# Patient Record
Sex: Male | Born: 1985 | Race: Black or African American | Hispanic: No | Marital: Married | Smoking: Never smoker
Health system: Southern US, Community
[De-identification: ages and names within clinical notes are randomized; demographics above are authoritative.]

---
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
## 2005-02-08 ENCOUNTER — Emergency Department (HOSPITAL_COMMUNITY): Admission: EM | Admit: 2005-02-08 | Discharge: 2005-02-08 | Payer: Self-pay | Admitting: Family Medicine

## 2006-02-27 ENCOUNTER — Emergency Department (HOSPITAL_COMMUNITY): Admission: EM | Admit: 2006-02-27 | Discharge: 2006-02-28 | Payer: Self-pay | Admitting: Emergency Medicine

## 2006-02-28 ENCOUNTER — Emergency Department (HOSPITAL_COMMUNITY): Admission: EM | Admit: 2006-02-28 | Discharge: 2006-02-28 | Payer: Self-pay | Admitting: Family Medicine

## 2006-08-27 ENCOUNTER — Emergency Department (HOSPITAL_COMMUNITY): Admission: EM | Admit: 2006-08-27 | Discharge: 2006-08-27 | Payer: Self-pay | Admitting: Emergency Medicine

## 2009-04-15 ENCOUNTER — Emergency Department (HOSPITAL_COMMUNITY): Admission: EM | Admit: 2009-04-15 | Discharge: 2009-04-15 | Payer: Self-pay | Admitting: Emergency Medicine

## 2009-06-17 ENCOUNTER — Emergency Department (HOSPITAL_COMMUNITY): Admission: EM | Admit: 2009-06-17 | Discharge: 2009-06-17 | Payer: Self-pay | Admitting: Emergency Medicine

## 2010-05-07 ENCOUNTER — Inpatient Hospital Stay (INDEPENDENT_AMBULATORY_CARE_PROVIDER_SITE_OTHER)
Admission: RE | Admit: 2010-05-07 | Discharge: 2010-05-07 | Disposition: A | Discharge status: Home / Self Care | Payer: Self-pay | Source: Ambulatory Visit | Attending: Emergency Medicine | Admitting: Emergency Medicine

## 2010-05-07 ENCOUNTER — Ambulatory Visit (INDEPENDENT_AMBULATORY_CARE_PROVIDER_SITE_OTHER): Payer: Self-pay

## 2010-05-07 DIAGNOSIS — R0789 Other chest pain: Secondary | ICD-10-CM

## 2011-03-16 ENCOUNTER — Emergency Department (INDEPENDENT_AMBULATORY_CARE_PROVIDER_SITE_OTHER)
Admission: EM | Admit: 2011-03-16 | Discharge: 2011-03-16 | Disposition: A | Discharge status: Home / Self Care | Payer: Self-pay | Source: Home / Self Care

## 2011-03-16 DIAGNOSIS — J019 Acute sinusitis, unspecified: Secondary | ICD-10-CM

## 2011-03-16 DIAGNOSIS — J209 Acute bronchitis, unspecified: Secondary | ICD-10-CM

## 2011-03-16 MED ORDER — GUAIFENESIN-CODEINE 100-10 MG/5ML PO SYRP: ORAL_SOLUTION | ORAL | Status: AC

## 2011-03-16 MED ORDER — AMOXICILLIN 875 MG PO TABS: 875.0000 mg | ORAL_TABLET | Freq: Two times a day (BID) | ORAL | Status: AC

## 2011-03-16 NOTE — ED Notes (Signed)
C/o URI type syx for past few days

## 2011-03-16 NOTE — ED Provider Notes (Signed)
History     CSN: [id number]  Arrival date & time 03/16/11  1621   None     Chief Complaint  Patient presents with  . URI    (Consider location/radiation/quality/duration/timing/severity/associated sxs/prior treatment) HPI Comments: Pt c/o nasal congestion, cough and sore throat for approximately one week. Nasal drainage is thick, yellow and green. Cough is nonproductive, and is disruptive to sleep. No fever or chills. He has tried cough and cold medications without improvement.    History reviewed. No pertinent past medical history.  History reviewed. No pertinent past surgical history.  History reviewed. No pertinent family history.  History  Substance Use Topics  . Smoking status: Never Smoker   . Smokeless tobacco: Not on file  . Alcohol Use: Yes     social      Review of Systems  Constitutional: Negative for fever, chills and fatigue.  HENT: Positive for congestion, sore throat, rhinorrhea, postnasal drip and sinus pressure. Negative for ear pain and sneezing.   Respiratory: Positive for cough. Negative for shortness of breath and wheezing.   Cardiovascular: Negative for chest pain and palpitations.    Allergies  Review of patient's allergies indicates no known allergies.  Home Medications   Current Outpatient Rx  Name Route Sig Dispense Refill  . AMOXICILLIN 875 MG PO TABS Oral Take 1 tablet (875 mg total) by mouth 2 (two) times daily. 20 tablet 0  . GUAIFENESIN-CODEINE 100-10 MG/5ML PO SYRP  1-2 tsp every 6 hrs prn cough 120 mL 0    BP 112/75  Pulse 85  Temp(Src) 98 F (36.7 C) (Oral)  Resp 16  SpO2 100%  Physical Exam  Nursing note and vitals reviewed. Constitutional: He appears well-developed and well-nourished. No distress.  HENT:  Head: Normocephalic and atraumatic.  Right Ear: Tympanic membrane, external ear and ear canal normal.  Left Ear: Tympanic membrane, external ear and ear canal normal.  Nose: Mucosal edema present. Right sinus  exhibits maxillary sinus tenderness. Left sinus exhibits maxillary sinus tenderness.  Mouth/Throat: Uvula is midline, oropharynx is clear and moist and mucous membranes are normal. No oropharyngeal exudate, posterior oropharyngeal edema or posterior oropharyngeal erythema.  Neck: Neck supple.  Cardiovascular: Normal rate, regular rhythm and normal heart sounds.   Pulmonary/Chest: Effort normal and breath sounds normal. No respiratory distress.  Lymphadenopathy:    He has no cervical adenopathy.  Neurological: He is alert.  Skin: Skin is warm and dry.  Psychiatric: He has a normal mood and affect.    ED Course  Procedures (including critical care time)  Labs Reviewed - No data to display No results found.   1. Acute sinusitis   2. Acute bronchitis       MDM  Maxillary sinus tenderness, congestion and cough.        Melody Comas, [place] 03/16/11 1904

## 2011-06-17 ENCOUNTER — Encounter (HOSPITAL_BASED_OUTPATIENT_CLINIC_OR_DEPARTMENT_OTHER): Payer: Self-pay | Admitting: *Deleted

## 2011-06-17 ENCOUNTER — Emergency Department (HOSPITAL_BASED_OUTPATIENT_CLINIC_OR_DEPARTMENT_OTHER)
Admission: EM | Admit: 2011-06-17 | Discharge: 2011-06-17 | Disposition: A | Discharge status: Home / Self Care | Payer: Self-pay | Attending: Emergency Medicine | Admitting: Emergency Medicine

## 2011-06-17 ENCOUNTER — Emergency Department (INDEPENDENT_AMBULATORY_CARE_PROVIDER_SITE_OTHER): Payer: Self-pay

## 2011-06-17 DIAGNOSIS — M62838 Other muscle spasm: Secondary | ICD-10-CM | POA: Insufficient documentation

## 2011-06-17 DIAGNOSIS — M542 Cervicalgia: Secondary | ICD-10-CM

## 2011-06-17 MED ORDER — CYCLOBENZAPRINE HCL 5 MG PO TABS: 5.0000 mg | ORAL_TABLET | Freq: Two times a day (BID) | ORAL | Status: AC | PRN

## 2011-06-17 MED ORDER — CYCLOBENZAPRINE HCL 10 MG PO TABS
5.0000 mg | ORAL_TABLET | Freq: Once | ORAL | Status: AC
  Administered 2011-06-17: 5 mg via ORAL
  Filled 2011-06-17: qty 1

## 2011-06-17 MED ORDER — HYDROCODONE-ACETAMINOPHEN 5-500 MG PO TABS: 1.0000 | ORAL_TABLET | Freq: Four times a day (QID) | ORAL | Status: AC | PRN

## 2011-06-17 MED ORDER — HYDROCODONE-ACETAMINOPHEN 5-325 MG PO TABS
1.0000 | ORAL_TABLET | Freq: Once | ORAL | Status: AC
  Administered 2011-06-17: 1 via ORAL
  Filled 2011-06-17: qty 1

## 2011-06-17 NOTE — ED Provider Notes (Signed)
History     CSN: [id number]  Arrival date & time 06/17/11  1743   First MD Initiated Contact with Patient 06/17/11 1754      Chief Complaint  Patient presents with  . Neck Pain    (Consider location/radiation/quality/duration/timing/severity/associated sxs/prior treatment) HPI Comments: Pt states that he was laying in bed and turned his head and now he feels like he can't turn his head:pt denies numbness or weakness:pt has a history of spasm in neck  Patient is a 26 y.o. male presenting with neck pain. The history is provided by the patient. No language interpreter was used.  Neck Pain  This is a new problem. The current episode started less than 1 hour ago. The problem occurs constantly. The problem has not changed since onset.The pain is associated with twisting. There has been no fever. The pain is present in the left side. The quality of the pain is described as shooting. The pain radiates to the left shoulder. The symptoms are aggravated by bending and twisting. Pertinent negatives include no numbness, no bladder incontinence, no tingling and no weakness. He has tried nothing for the symptoms.    History reviewed. No pertinent past medical history.  History reviewed. No pertinent past surgical history.  No family history on file.  History  Substance Use Topics  . Smoking status: Never Smoker   . Smokeless tobacco: Not on file  . Alcohol Use: Yes     social      Review of Systems  Constitutional: Negative.   HENT: Positive for neck pain.   Respiratory: Negative.   Cardiovascular: Negative.   Genitourinary: Negative for bladder incontinence.  Neurological: Negative for tingling, weakness and numbness.    Allergies  Review of patient's allergies indicates no known allergies.  Home Medications  No current outpatient prescriptions on file.  BP 97/53  Pulse 73  Resp 18  SpO2 100%  Physical Exam  Nursing note and vitals reviewed. Constitutional: He is oriented  to person, place, and time. He appears well-developed and well-nourished.  HENT:  Head: Normocephalic and atraumatic.  Eyes: Conjunctivae and EOM are normal.  Neck: Neck supple.  Cardiovascular: Normal rate and regular rhythm.   Pulmonary/Chest: Effort normal and breath sounds normal.  Musculoskeletal:       Cervical back: He exhibits decreased range of motion and tenderness.       Thoracic back: Normal.       Lumbar back: Normal.  Neurological: He is alert and oriented to person, place, and time. He exhibits normal muscle tone. Coordination normal.  Skin: Skin is dry.  Psychiatric: He has a normal mood and affect.    ED Course  Procedures (including critical care time)  Labs Reviewed - No data to display Dg Cervical Spine Complete  06/17/2011  *RADIOLOGY REPORT*  Clinical Data: Left neck pain for 1 hour.  CERVICAL SPINE - COMPLETE 4+ VIEW  Comparison: None.  Findings: No prevertebral soft tissue swelling is identified.  No cervical vertebral malalignment noted.  No cervical spine fracture is evident.  IMPRESSION:  No significant abnormality identified.  Original Report Authenticated By: Dellia Cloud, M.D.     1. Muscle spasm       MDM  Likely spasm based on exam:pt is feeling better after the medications here        Teressa Lower, NP 06/17/11 1916

## 2011-06-17 NOTE — Discharge Instructions (Signed)
Muscle Cramps Muscle cramps are when muscles tighten by themselves. Muscle cramps usually improve or go away within minutes. HOME CARE  Massage the muscle.   Stretch the muscle.   Relax the muscle.   Only take medicine as told by your doctor.   Drink enough fluids to keep your pee (urine) clear or pale yellow.  GET HELP RIGHT AWAY IF:  Cramps are frequent and do not get better with medicine. MAKE SURE YOU:  Understand these intructions.   Will watch your condition.   Will get help right away if your are not doing well or get worse.  Document Released: 02/11/2008 Document Revised: 02/17/2011 Document Reviewed: 02/20/2008 [employer] Patient Information 2012 [employer]. 

## 2011-06-17 NOTE — ED Notes (Signed)
Pt sts sudden onset of sharp left neck and shoulder pain without injury 1 hour ago.

## 2011-06-17 NOTE — ED Notes (Signed)
Pt states pain at tolerable level. Ambulates without distress.

## 2011-06-18 NOTE — ED Provider Notes (Signed)
Medical screening examination/treatment/procedure(s) were performed by non-physician practitioner and as supervising physician I was immediately available for consultation/collaboration.   Glynn Octave, MD 06/18/11 [phone]

## 2013-03-10 ENCOUNTER — Emergency Department (HOSPITAL_BASED_OUTPATIENT_CLINIC_OR_DEPARTMENT_OTHER): Payer: Self-pay

## 2013-03-10 ENCOUNTER — Emergency Department (HOSPITAL_BASED_OUTPATIENT_CLINIC_OR_DEPARTMENT_OTHER)
Admission: EM | Admit: 2013-03-10 | Discharge: 2013-03-10 | Disposition: A | Discharge status: Home / Self Care | Payer: Self-pay | Attending: Emergency Medicine | Admitting: Emergency Medicine

## 2013-03-10 ENCOUNTER — Encounter (HOSPITAL_BASED_OUTPATIENT_CLINIC_OR_DEPARTMENT_OTHER): Payer: Self-pay | Admitting: Emergency Medicine

## 2013-03-10 DIAGNOSIS — J111 Influenza due to unidentified influenza virus with other respiratory manifestations: Secondary | ICD-10-CM | POA: Insufficient documentation

## 2013-03-10 DIAGNOSIS — IMO0001 Reserved for inherently not codable concepts without codable children: Secondary | ICD-10-CM | POA: Insufficient documentation

## 2013-03-10 DIAGNOSIS — R6889 Other general symptoms and signs: Secondary | ICD-10-CM

## 2013-03-10 LAB — BASIC METABOLIC PANEL
BUN: 14 mg/dL (ref 6–23)
CO2: 25 mEq/L (ref 19–32)
Glucose, Bld: 89 mg/dL (ref 70–99)
Potassium: 3.9 mEq/L (ref 3.5–5.1)
Sodium: 139 mEq/L (ref 135–145)

## 2013-03-10 MED ORDER — ONDANSETRON HCL 4 MG/2ML IJ SOLN
4.0000 mg | Freq: Once | INTRAMUSCULAR | Status: AC
  Administered 2013-03-10: 4 mg via INTRAVENOUS
  Filled 2013-03-10: qty 2

## 2013-03-10 MED ORDER — PHENYLEPH-PROMETHAZINE-COD 5-6.25-10 MG/5ML PO SYRP: 5.0000 mL | ORAL_SOLUTION | ORAL | Status: DC | PRN

## 2013-03-10 MED ORDER — SODIUM CHLORIDE 0.9 % IV BOLUS (SEPSIS)
1000.0000 mL | Freq: Once | INTRAVENOUS | Status: AC
  Administered 2013-03-10: 1000 mL via INTRAVENOUS

## 2013-03-10 NOTE — ED Notes (Signed)
Pt reports URI, congestion, productive cough since Tuesday.  Denies fevers.

## 2013-03-10 NOTE — ED Provider Notes (Signed)
CSN: [id number]     Arrival date & time 03/10/13  1355 History   First MD Initiated Contact with Patient 03/10/13 1745     Chief Complaint  Patient presents with  . URI   (Consider location/radiation/quality/duration/timing/severity/associated sxs/prior Treatment) Patient is a 27 y.o. male presenting with URI.  URI Presenting symptoms: congestion, cough, fever and sore throat   Severity:  Moderate Onset quality:  Gradual Duration:  3 days Timing:  Constant Progression:  Worsening Chronicity:  New Relieved by:  Nothing Worsened by:  Nothing tried Ineffective treatments:  OTC medications Associated symptoms: myalgias and sneezing   Associated symptoms: no headaches, no neck pain, no swollen glands and no wheezing   Risk factors: sick contacts    Donald Baird is a 27 y.o. male who presents to the ED with cough, cold, congestion that started 5 days ago. No relief with OTC medications. really hot and took his temperature and it was 101.   History reviewed. No pertinent past medical history. History reviewed. No pertinent past surgical history. History reviewed. No pertinent family history. History  Substance Use Topics  . Smoking status: Never Smoker   . Smokeless tobacco: Not on file  . Alcohol Use: Yes     Comment: social    Review of Systems  Constitutional: Positive for fever and chills.  HENT: Positive for congestion, sneezing and sore throat. Negative for trouble swallowing.   Eyes: Negative for visual disturbance.  Respiratory: Positive for cough. Negative for wheezing.   Cardiovascular: Negative for chest pain.  Gastrointestinal: Negative for nausea and vomiting.  Genitourinary: Negative for dysuria, urgency and frequency.  Musculoskeletal: Positive for myalgias. Negative for neck pain.  Allergic/Immunologic: Negative for immunocompromised state.  Neurological: Negative for syncope and headaches.  Psychiatric/Behavioral: Negative for confusion. The patient  is not nervous/anxious.     Allergies  Review of patient's allergies indicates no known allergies.  Home Medications  No current outpatient prescriptions on file. BP 104/71  Pulse 71  Temp(Src) 98.3 F (36.8 C) (Oral)  Resp 18  Ht 5\' 7"  (1.702 m)  Wt 125 lb (56.7 kg)  BMI 19.57 kg/m2  SpO2 99% Physical Exam  Nursing note and vitals reviewed. Constitutional: He is oriented to person, place, and time. He appears well-developed and well-nourished. No distress.  HENT:  Head: Normocephalic and atraumatic.  Eyes: EOM are normal.  Neck: Neck supple.  Cardiovascular: Normal rate and regular rhythm.   Pulmonary/Chest: Effort normal. He has no wheezes. He has no rales. He exhibits no tenderness.  Abdominal: Soft. There is no tenderness.  Musculoskeletal: Normal range of motion.  Neurological: He is alert and oriented to person, place, and time. No cranial nerve deficit.  Skin: Skin is warm and dry.  Psychiatric: He has a normal mood and affect. His behavior is normal.    ED Course  Procedures (including critical care time) Labs Review Labs Reviewed  RAPID STREP SCREEN  CULTURE, GROUP A STREP   Imaging Review Dg Chest 2 View  03/10/2013   CLINICAL DATA:  Cough and congestion  EXAM: CHEST  2 VIEW  COMPARISON:  May 07, 2010  FINDINGS: The lungs are clear. The heart size and pulmonary vascularity are normal. No adenopathy. There is mild lower thoracic levoscoliosis.  IMPRESSION: No edema or consolidation.   Electronically Signed   By: Bretta Bang M.D.   On: 03/10/2013 17:22    EKG Interpretation   None     Negative strep screen, normal BMP and CXR  without pneumonia.  Will treat symptoms.   MDM  27 y.o. male with flu like symptoms. I have reviewed this patient's vital signs, nurses notes, appropriate labs and imaging.  I have discussed findings with the patient and plan of care. He voices understanding.    Medication List         Phenyleph-Promethazine-Cod  5-6.25-10 MG/5ML Syrp  Take 5 mLs by mouth every 4 (four) hours as needed.          Janne Napoleon, [place] 03/13/13 [phone]

## 2013-03-12 LAB — CULTURE, GROUP A STREP

## 2013-03-13 NOTE — ED Provider Notes (Signed)
Medical screening examination/treatment/procedure(s) were performed by non-physician practitioner and as supervising physician I was immediately available for consultation/collaboration.  EKG Interpretation   None         Megan E Docherty, MD 03/13/13 1537 

## 2013-07-15 ENCOUNTER — Encounter: Payer: Self-pay | Admitting: Medical

## 2014-04-01 ENCOUNTER — Emergency Department (HOSPITAL_COMMUNITY)
Admission: EM | Admit: 2014-04-01 | Discharge: 2014-04-01 | Disposition: A | Discharge status: Home / Self Care | Payer: BLUE CROSS/BLUE SHIELD | Attending: Emergency Medicine | Admitting: Emergency Medicine

## 2014-04-01 ENCOUNTER — Emergency Department (HOSPITAL_COMMUNITY): Payer: BLUE CROSS/BLUE SHIELD

## 2014-04-01 ENCOUNTER — Encounter (HOSPITAL_COMMUNITY): Payer: Self-pay | Admitting: Emergency Medicine

## 2014-04-01 DIAGNOSIS — J3489 Other specified disorders of nose and nasal sinuses: Secondary | ICD-10-CM | POA: Insufficient documentation

## 2014-04-01 DIAGNOSIS — R197 Diarrhea, unspecified: Secondary | ICD-10-CM | POA: Insufficient documentation

## 2014-04-01 DIAGNOSIS — R6889 Other general symptoms and signs: Secondary | ICD-10-CM

## 2014-04-01 DIAGNOSIS — R05 Cough: Secondary | ICD-10-CM | POA: Insufficient documentation

## 2014-04-01 DIAGNOSIS — R509 Fever, unspecified: Secondary | ICD-10-CM | POA: Insufficient documentation

## 2014-04-01 DIAGNOSIS — R0602 Shortness of breath: Secondary | ICD-10-CM | POA: Diagnosis not present

## 2014-04-01 DIAGNOSIS — R112 Nausea with vomiting, unspecified: Secondary | ICD-10-CM | POA: Insufficient documentation

## 2014-04-01 DIAGNOSIS — R0981 Nasal congestion: Secondary | ICD-10-CM | POA: Diagnosis present

## 2014-04-01 MED ORDER — ONDANSETRON HCL 4 MG PO TABS: 4.0000 mg | ORAL_TABLET | Freq: Four times a day (QID) | ORAL | Status: DC

## 2014-04-01 MED ORDER — HYDROCOD POLST-CHLORPHEN POLST 10-8 MG/5ML PO LQCR: 5.0000 mL | Freq: Two times a day (BID) | ORAL | Status: DC | PRN

## 2014-04-01 NOTE — ED Notes (Signed)
Pt. reports nasal congestion , productive cough , sore throat runny nose onset Friday last week , denies fever or chills.

## 2014-04-01 NOTE — ED Provider Notes (Signed)
CSN: [id number]     Arrival date & time 04/01/14  0012 History   First MD Initiated Contact with Patient 04/01/14 0022     Chief Complaint  Patient presents with  . Nasal Congestion  . Cough     (Consider location/radiation/quality/duration/timing/severity/associated sxs/prior Treatment) Patient is a 29 y.o. male presenting with cough. The history is provided by the patient.  Cough Cough characteristics:  Productive Sputum characteristics:  Yellow Severity:  Moderate Onset quality:  Gradual Duration:  3 days Timing:  Intermittent Progression:  Worsening Chronicity:  New Smoker: no   Relieved by:  Nothing Worsened by:  Lying down Ineffective treatments:  Decongestant, fluids and cough suppressants Associated symptoms: fever and shortness of breath    Donald DolinClarence L Baird is a 29 y.o. male who presents to the ED with cough, cold and congestion that started 3 days ago. The symptoms have gotten worse and he wakes up coughing and sweating. He has been taking OTC medications without relief. Today he had 2 watery stools and one episode of vomiting. He is unsure if he has had fever.   History reviewed. No pertinent past medical history. History reviewed. No pertinent past surgical history. No family history on file. History  Substance Use Topics  . Smoking status: Never Smoker   . Smokeless tobacco: Not on file  . Alcohol Use: Yes     Comment: social    Review of Systems  Constitutional: Positive for fever.  Respiratory: Positive for cough and shortness of breath.   Gastrointestinal: Positive for nausea, vomiting and diarrhea.  All other systems negative     Allergies  Review of patient's allergies indicates no known allergies.  Home Medications   Prior to Admission medications   Medication Sig Start Date End Date Taking? Authorizing Provider  chlorpheniramine-HYDROcodone (TUSSIONEX PENNKINETIC ER) 10-8 MG/5ML LQCR Take 5 mLs by mouth every 12 (twelve) hours as needed  for cough. 04/01/14   Hope Orlene OchM Neese, NP  ondansetron (ZOFRAN) 4 MG tablet Take 1 tablet (4 mg total) by mouth every 6 (six) hours. 04/01/14   Hope Orlene OchM Neese, NP  Phenyleph-Promethazine-Cod 5-6.25-10 MG/5ML SYRP Take 5 mLs by mouth every 4 (four) hours as needed. 03/10/13   Hope Orlene OchM Neese, NP   BP 108/65 mmHg  Pulse 63  Temp(Src) 97.6 F (36.4 C) (Oral)  Resp 14  Ht 5\' 7"  (1.702 m)  Wt 123 lb (55.792 kg)  BMI 19.26 kg/m2  SpO2 100% Physical Exam  Constitutional: He is oriented to person, place, and time. He appears well-developed and well-nourished. No distress.  HENT:  Head: Normocephalic.  Right Ear: Tympanic membrane normal.  Left Ear: Tympanic membrane normal.  Nose: Mucosal edema and rhinorrhea present.  Mouth/Throat: Uvula is midline, oropharynx is clear and moist and mucous membranes are normal.  Eyes: EOM are normal.  Neck: Normal range of motion. Neck supple.  Cardiovascular: Normal rate and regular rhythm.   Pulmonary/Chest: Effort normal. No respiratory distress. He has no wheezes. He has no rales.  Abdominal: Soft. There is no tenderness.  Musculoskeletal: Normal range of motion.  Lymphadenopathy:    He has no cervical adenopathy.  Neurological: He is alert and oriented to person, place, and time. No cranial nerve deficit.  Skin: Skin is warm and dry.  Psychiatric: He has a normal mood and affect. His behavior is normal.  Nursing note and vitals reviewed.   ED Course  Procedures (including critical care time) Labs Review Labs Reviewed - No data to display  Imaging Review Dg Chest 2 View  04/01/2014   CLINICAL DATA:  Cough, fever, chills and body aches. Diarrhea for 3 days.  EXAM: CHEST  2 VIEW  COMPARISON:  03/02/2013.  FINDINGS: Normal heart, mediastinum and hila. Clear lungs. No pleural effusion or pneumothorax. Bony thorax is unremarkable.  IMPRESSION: Normal chest radiographs.   Electronically Signed   By: Amie Portland M.D.   On: 04/01/2014 01:19    MDM  29 y.o.  male with cough, congestion, sore throat x 3 days and  n/v/d x 1 day. Stable for discharge with O2 SAT 100% on R/A. No signs of dehydration. Will treat for bronchitis and n/v/d. He will return for worsening symptoms. I did discuss with the patient that this is most likely a viral illness, however, with his hx of night sweats and diarrhea we would offer HIV screening. He declines since he has not risk factor for HIV.   Final diagnoses:  Flu-like symptoms  Nausea vomiting and diarrhea      Janne Napoleon, NP 04/01/14 0130  Purvis Sheffield, MD 04/01/14 [phone]

## 2014-04-01 NOTE — ED Notes (Signed)
Pt. Left with all belongings and refused wheelchair 

## 2015-03-30 ENCOUNTER — Emergency Department (HOSPITAL_COMMUNITY)
Admission: EM | Admit: 2015-03-30 | Discharge: 2015-03-31 | Disposition: A | Discharge status: Home / Self Care | Payer: BLUE CROSS/BLUE SHIELD | Attending: Emergency Medicine | Admitting: Emergency Medicine

## 2015-03-30 ENCOUNTER — Encounter (HOSPITAL_COMMUNITY): Payer: Self-pay | Admitting: Emergency Medicine

## 2015-03-30 DIAGNOSIS — R21 Rash and other nonspecific skin eruption: Secondary | ICD-10-CM | POA: Insufficient documentation

## 2015-03-30 MED ORDER — FLUORESCEIN SODIUM 1 MG OP STRP
1.0000 | ORAL_STRIP | Freq: Once | OPHTHALMIC | Status: AC
  Administered 2015-03-30: 1 via OPHTHALMIC
  Filled 2015-03-30: qty 1

## 2015-03-30 MED ORDER — TETRACAINE HCL 0.5 % OP SOLN
1.0000 [drp] | Freq: Once | OPHTHALMIC | Status: AC
  Administered 2015-03-30: 1 [drp] via OPHTHALMIC
  Filled 2015-03-30: qty 2

## 2015-03-30 NOTE — ED Notes (Signed)
Tetracaine and fluorescein strip at bedside for PA-C use

## 2015-03-30 NOTE — ED Provider Notes (Signed)
CSN: [id number]     Arrival date & time 03/30/15  2318 History  By signing my name below, I, Elon Spanner, attest that this documentation has been prepared under the direction and in the presence of Glean Hess, [place]. Electronically Signed: Elon Spanner ED Scribe. 03/30/2015. 11:29 PM.    Chief Complaint  Patient presents with  . Rash    The history is provided by the patient. No language interpreter was used.   HPI Comments: Donald Baird is a 30 y.o. male who presents to the Emergency Department complaining of a constant, intermittently itching rash beside the right eye onset 1 month ago.  He reports the rash seems to worsen while at work and improve with taking a shower.  The patient was seen by a dermatologist two weeks ago.  He was dx'd with eczema, prescribed desonide ointment, and told to keep the area moisturized with vaseline.  This tx caused the rash to shrink from covering his whole face to its current location, however, he believes the focal area today is more intense.  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 He denies new medications, face creams/soaps/lotions, known bites.  He denies fever, chills, eye pain, eye redness, eye drainage, vision changes, photophobia.    History reviewed. No pertinent past medical history. History reviewed. No pertinent past surgical history. History reviewed. No pertinent family history. Social History  Substance Use Topics  . Smoking status: Never Smoker   . Smokeless tobacco: None  . Alcohol Use: Yes     Comment: social      Review of Systems  Constitutional: Negative for fever and chills.  Eyes: Negative for photophobia, pain, discharge, redness, itching and visual disturbance.  Skin: Positive for rash.     Allergies  Review of patient's allergies indicates no known allergies.  Home Medications   Prior to Admission medications   Medication Sig Start Date End  Date Taking? Authorizing Provider  chlorpheniramine-HYDROcodone (TUSSIONEX PENNKINETIC ER) 10-8 MG/5ML LQCR Take 5 mLs by mouth every 12 (twelve) hours as needed for cough. 04/01/14   Hope Orlene Och, NP  ondansetron (ZOFRAN) 4 MG tablet Take 1 tablet (4 mg total) by mouth every 6 (six) hours. 04/01/14   Hope Orlene Och, NP  Phenyleph-Promethazine-Cod 5-6.25-10 MG/5ML SYRP Take 5 mLs by mouth every 4 (four) hours as needed. 03/10/13   Hope Orlene Och, NP    BP 109/77 mmHg  Pulse 76  Temp(Src) 98.4 F (36.9 C) (Oral)  Resp 16  Ht 5\' 7"  (1.702 m)  Wt 56.7 kg  BMI 19.57 kg/m2  SpO2 99% Physical Exam  Constitutional: He is oriented to person, place, and time. He appears well-developed and well-nourished. No distress.  HENT:  Head: Normocephalic and atraumatic.    Right Ear: External ear normal.  Left Ear: External ear normal.  Nose: Nose normal.  Mouth/Throat: Oropharynx is clear and moist.  Eyes: Conjunctivae, EOM and lids are normal. Pupils are equal, round, and reactive to light. Right eye exhibits no discharge and no exudate. No foreign body present in the right eye. Left eye exhibits no discharge and no exudate. No foreign body present in the left eye. Right conjunctiva is not injected. Right conjunctiva has no hemorrhage. Left conjunctiva is not injected. Left conjunctiva has no hemorrhage. No scleral icterus.  Slit lamp exam:      The right eye shows no corneal abrasion, no corneal flare, no corneal ulcer, no foreign  body, no hyphema and no fluorescein uptake.       The left eye shows no corneal abrasion, no corneal flare, no corneal ulcer, no foreign body, no hyphema and no fluorescein uptake.  Neck: Normal range of motion. Neck supple.  Cardiovascular: Normal rate and regular rhythm.   Pulmonary/Chest: Effort normal and breath sounds normal. No respiratory distress.  Musculoskeletal: Normal range of motion. He exhibits no edema or tenderness.  Neurological: He is alert and oriented to  person, place, and time.  Skin: Skin is warm and dry. Rash noted. He is not diaphoretic.  Papular rash to right cheek lateral to right eye. No significant erythema. No drainage. No vesicles. No skin sloughing.   Psychiatric: He has a normal mood and affect. His behavior is normal.  Nursing note and vitals reviewed.   ED Course  Procedures (including critical care time)  DIAGNOSTIC STUDIES: Oxygen Saturation is 99% on RA, normal by my interpretation.    COORDINATION OF CARE:  Labs Review Labs Reviewed - No data to display  Imaging Review No results found.    EKG Interpretation None      MDM   Final diagnoses:  Rash    30 year old male presents with rash to the right side of his face. States his symptoms have been present for the past month, and initially his rash was diffuse, however is now concentrated to the right side of his face. He reports associated itching, and states his symptoms seem to flare while he is at work (he notes exposure to dust particles) and improve after he washes his face or showers when he comes home. He denies fever, chills, eye pain, eye redness, photophobia, vision changes.  Patient is afebrile. Vital signs stable. On exam, patient has papular rash to right cheek lateral to right eye. No significant erythema or drainage to suggest infection. No vesicles. No skin sloughing. No conjunctival injection. PERRL. EOMs intact. No abrasion or increased fluorescein uptake on slit lamp exam.   Symptoms seem to be related to work exposure, may be contact dermatitis. Advised to try over the counter hydrocortisone cream and to use eye protection while at work (recommended safety glasses to limit dust particles contacting face). Recommended he can try benadryl at night for itching. Patient is non-toxic and well-appearing, feel he is stable for discharge at this time. Return precautions discussed. Patient to follow-up with dermatologist for further evaluation and  management. Patient verbalizes his understanding and is in agreement with plan.  BP 109/77 mmHg  Pulse 76  Temp(Src) 98.4 F (36.9 C) (Oral)  Resp 16  Ht 5\' 7"  (1.702 m)  Wt 56.7 kg  BMI 19.57 kg/m2  SpO2 99%  I personally performed the services described in this documentation, which was scribed in my presence. The recorded information has been reviewed and is accurate.    Mady Gemmalizabeth C Westfall, PA-C 03/31/15 0104  Leta BaptistEmily Roe Nguyen, MD 04/08/15 2112

## 2015-03-30 NOTE — ED Notes (Signed)
Patient c/o rash beside R eye, about ping-pong ball size now x 1 month. Patient states it started out as a small spot and was seen by a dermatologist, who diagnosed him with eczema and was given Desonide Ointment to use for 2 weeks. Spot has increased in size and has become more itchy. Patient also c/o R eye irritation in the morning.

## 2015-03-31 NOTE — Discharge Instructions (Signed)
1. Medications: try over the counter topical hydrocortisone cream, benadryl for itching, usual home medications 2. Treatment: rest, drink plenty of fluids 3. Follow Up: please followup with dermatology for discussion of your diagnoses and further evaluation after today's visit; if you do not have a primary care doctor use the resource guide provided to find one; please return to the ER for redness, swelling, discharge, eye pain, vision changes, new or worsening symptoms   Emergency Department Resource Guide 1) Find a Doctor and Pay Out of Pocket Although you won't have to find out who is covered by your insurance plan, it is a good idea to ask around and get recommendations. You will then need to call the office and see if the doctor you have chosen will accept you as a new patient and what types of options they offer for patients who are self-pay. Some doctors offer discounts or will set up payment plans for their patients who do not have insurance, but you will need to ask so you aren't surprised when you get to your appointment.  2) Contact Your Local Health Department Not all health departments have doctors that can see patients for sick visits, but many do, so it is worth a call to see if yours does. If you don't know where your local health department is, you can check in your phone book. The CDC also has a tool to help you locate your state's health department, and many state websites also have listings of all of their local health departments.  3) Find a Walk-in Clinic If your illness is not likely to be very severe or complicated, you may want to try a walk in clinic. These are popping up all over the country in pharmacies, drugstores, and shopping centers. They're usually staffed by nurse practitioners or physician assistants that have been trained to treat common illnesses and complaints. They're usually fairly quick and inexpensive. However, if you have serious medical issues or chronic  medical problems, these are probably not your best option.  No Primary Care Doctor: - Call Health Connect at  [phone] - they can help you locate a primary care doctor that  accepts your insurance, provides certain services, etc. - Physician Referral Service- [phone]  Chronic Pain Problems: Organization         Address  Phone   Notes  [facility]  [phone] Patients need to be referred by their primary care doctor.   Medication Assistance: Organization         Address  Phone   Notes  [facility] Medication [facility] [address] [place] [place], [place] [address] [phone] --Must be a resident of [facility] -- Must have NO insurance coverage whatsoever (no Medicaid/ Medicare, etc.) -- The pt. MUST have a primary care doctor that directs their care regularly and follows them in the community   MedAssist  [phone]   [employer]  [phone]    Agencies that provide inexpensive medical care: Organization         Address  Phone   Notes  [facility]  [phone]   [facility]    [phone]   [facility] [address] [place], [place] [address] [phone]   [facility] [address] [place] [place] [phone]   Planned Parenthood    [phone]   [facility]    [phone]  Community Health and [place]  [place] [address], [place] Phone:  [phone], Fax:  [phone] Hours of Operation:  9 am - 6 pm, M-F.  Also accepts Medicaid/Medicare and self-pay.  [facility] for [place] [address], [place] Phone: [phone], Fax: [phone]. Hours of Operation:  8:30 am - 5:30 pm, M-F.  Also accepts Medicaid and self-pay.  [facility] [place] [address] [place] Phone: [phone]   [place], [place], [place] [phone],  Ext. [phone] Mondays & Thursdays: 7-9 AM.  First 15 patients are seen on a first come, first serve basis.    [place] Providers:  Organization         Address  Phone   Notes  [facility] [address], [place] [phone] Also accepts self-pay patients.  [facility] [id number] [place], [place]  [phone]   [place], [address], [place] [phone]   [facility] [address], [place] [phone]   Lucianne Lei [address], [place]   [phone] Only accepts [place] Access [place] patients after they have their name applied to their card.   Self-Pay (no insurance) in [facility]:  Organization         Address  Phone   Notes  Sickle Cell Patients, [facility] [place] [phone]   [facility] Urgent Care [place] [phone]   [facility] [place]  [place], [place], [place] [phone]   [facility]/Dr. Osei-Bonsu  [address], [place] [phone] Phone number for both [place] and [place] locations is the same.  Urgent Medical and [facility] [address] [place] [phone]   [facility] [address], [place] or [address] [phone]   [facility] [address], [place] [phone], phone; [phone], fax Sees patients 1st and 3rd Saturday of every month.  Must not qualify for public or private insurance (i.e. Medicaid, Medicare, [place] Health Choice, Veterans' Benefits)  Household income should be no more than 200% of the poverty level The clinic cannot treat you if you are pregnant or think you are pregnant  Sexually transmitted diseases are not  treated at the clinic.    Dental Care: Organization         Address  Phone  Notes  [facility] Department of [facility] [place] [phone] Accepts children up to age 60 who are enrolled in [place] or [place]; pregnant women with a Medicaid card; and children who have applied for Medicaid or [place] Health Choice, but were declined, whose parents can pay a reduced fee at time of service.  [facility] Department of [facility]  [address], [place] [phone] Accepts children up to age 20 who are enrolled in [place] or [place]; pregnant women with a Medicaid card; and children who have applied for Medicaid or [place] Health Choice, but were declined, whose parents can pay a reduced fee at time of service.  [place]  [phone]  [place] [phone] Patients are seen by appointment only. Walk-ins are not accepted. [place] will see patients 19 years of age and older. Monday - Tuesday (8am-5pm) Most Wednesdays (8:30-5pm) $30 per visit, cash only  [facility] Adult Dental Access PROGRAM  [address], [facility] [phone] Patients are seen by appointment only. Walk-ins are not accepted. [place] will see patients 68 years of age and older. One Wednesday Evening (Monthly: Volunteer Based).  $30 per visit, cash only  [place]  [phone] for adults; Children under age 42, call Graduate Pediatric Dentistry at [phone]. Children aged 50-14, please call [phone] to request a pediatric application.  Dental services are provided in all areas of dental care including fillings, crowns and bridges, complete and partial dentures, implants, gum treatment, root canals, and extractions. Preventive care is also provided. Treatment is provided to both adults and children. Patients are selected via a lottery and there is  often a waiting list.   [facility] [address] [place]  [phone] [email or web address]   [facility] [address] [place], [place] [phone], Ext. [phone] Second and Fourth Thursday of each month, opens at 6:30 AM; Clinic ends at 9 AM.  Patients are seen on a first-come first-served basis, and a limited number are seen during each clinic.   [facility]  [address] Hillard Danker [place], [place] [phone]   Eligibility Requirements You must have lived in [place], [place], or [place] counties for at least the last three months.   You cannot be eligible for state or federal sponsored [employer], including [employer], [place], or [employer].   You generally cannot be eligible for healthcare insurance through your employer.    How to apply: Eligibility screenings are held every Tuesday and Wednesday afternoon from 1:00 pm until 4:00 pm. You do not need an appointment for the interview!  [facility] [address] [place], [place]   [place]  [place] Department  [place]  [phone]    Behavioral Health Resources in the Community: Intensive Outpatient Programs Organization         Address  Phone  Notes  [facility] [place]. [address] [place], [place] [phone]   [facility] Outpatient [address], [place], [place]   ADS: Alcohol & Drug S[facility]cs [address], [place], [place]   [place] [address]  [place], [place] or [phone]   Substance Abuse Resources Organization         Address  Phone  Notes  Alcohol and Drug Services  [phone]   [place]  [phone]   [facility] [place]   Chinita Pester  [phone]   Residential & Outpatient Substance  Abuse Program  [phone]   Psychological Services Organization         Address  Phone  Notes  [facility] [place]  [place]  [phone]   [place] [address] [place] or [phone]    Mobile Crisis Teams Organization         Address  Phone  Notes  [facility], Mobile Crisis Care Unit  [phone]   [facility]otherapeutic [facility]  [address] [place], [place]  [facility] [address], [address] [place] [phone]    Self-Help/Support Groups Organization         Address  Phone             Notes  [facility] [place] - variety of support groups  [place] Call for more information  Narcotics Anonymous (NA), [facility] [address], [employer] [place]  2 meetings at this location   Special educational needs teacher         Address  Phone  Notes  [facility] [place],    [place]  [phone]   [facility]  [address] [place] [phone], [place], [place]   [place], [place] [phone] Admissions: 8am-3pm M-F  Incentives Substance [place] [address],    [place], [place] [phone]   [facility] [address] [place], [place], [place]   [facility] [address]  [place], [place]   [facility] Programs - Intensive Outpatient [address] Kristeen [address], [place], [place]   [facility] ([place].,  [place], [place] [phone] or [phone]   [facility] (RTS) [address], [place], [place] Accepts Medicaid  [facility] [place] [address]  [place] [phone] Substance Abuse/Addiction Treatment   [facility] Organization          Address  Phone  Notes  [facility]  [phone]   Domenic Schwab, PhD [address] Arlis Porta [place], [place]   2818698019 or [phone]   [place], [place] [phone]   [facility] [address] [place], [place] [phone] Insurance/Medicaid/sponsorship through [facility] and Families [address] [place]                                    [place], [place] [phone] [place] [address], [place] [phone]    Dr. Adele Schilder  [phone]   [facility] Dept. 1) [address] [place] 2) [place] 3)  [place] [address], [place] [phone]  [phone]   [place] [phone] or [phone] (After Hours)

## 2015-03-31 NOTE — ED Notes (Signed)
Patient verbalized understanding of discharge instructions and denies any further needs or questions at this time. VS stable. Patient ambulatory with steady gait.  

## 2016-10-30 ENCOUNTER — Encounter (HOSPITAL_COMMUNITY): Payer: Self-pay | Admitting: Emergency Medicine

## 2016-10-30 ENCOUNTER — Emergency Department (HOSPITAL_COMMUNITY)
Admission: EM | Admit: 2016-10-30 | Discharge: 2016-10-30 | Disposition: A | Discharge status: Home / Self Care | Payer: BLUE CROSS/BLUE SHIELD | Attending: Emergency Medicine | Admitting: Emergency Medicine

## 2016-10-30 DIAGNOSIS — L309 Dermatitis, unspecified: Secondary | ICD-10-CM | POA: Insufficient documentation

## 2016-10-30 DIAGNOSIS — Z79899 Other long term (current) drug therapy: Secondary | ICD-10-CM | POA: Insufficient documentation

## 2016-10-30 MED ORDER — TRIAMCINOLONE ACETONIDE 0.1 % EX CREA: 1.0000 "application " | TOPICAL_CREAM | Freq: Two times a day (BID) | CUTANEOUS | 0 refills | Status: DC

## 2016-10-30 NOTE — Discharge Instructions (Signed)
Please read attached information regarding your condition. Take Zyrtec or Claritin for allergic symptoms. Use triamcinolone cream as directed in affected areas. Follow up with dermatologist for further evaluation. Return to ED for worsening symptoms, vision changes, signs of severe allergic reaction, injuries.

## 2016-10-30 NOTE — ED Provider Notes (Signed)
[facility] DEPT Provider Note   CSN: [id number] Arrival date & time: 10/30/16  1901     History   Chief Complaint Chief Complaint  Patient presents with  . Rash    HPI BRAN LYU is a 31 y.o. male.  HPI  Patient presents to ED for evaluation of rash on bilateral eyelids that has been going on for the past 2 months. He states this feels similar to his eczema flareups. He has tried hydrocortisone cream, Cetaphil face wash with some improvement in his symptoms. He also reports associated a tree and watery eyes as well as rhinorrhea. He states that he has not followed up with his dermatologist for this recently. He denies use of any antihistamines. He denies any vision changes, foreign body sensation, injuries, trouble breathing, trouble swallowing.  History reviewed. No pertinent past medical history.  There are no active problems to display for this patient.   History reviewed. No pertinent surgical history.     Home Medications    Prior to Admission medications   Medication Sig Start Date End Date Taking? Authorizing Provider  chlorpheniramine-HYDROcodone (TUSSIONEX PENNKINETIC ER) 10-8 MG/5ML LQCR Take 5 mLs by mouth every 12 (twelve) hours as needed for cough. 04/01/14   Janne Napoleon, NP  ondansetron (ZOFRAN) 4 MG tablet Take 1 tablet (4 mg total) by mouth every 6 (six) hours. 04/01/14   Janne Napoleon, NP  Phenyleph-Promethazine-Cod 5-6.25-10 MG/5ML SYRP Take 5 mLs by mouth every 4 (four) hours as needed. 03/10/13   Janne Napoleon, NP  triamcinolone cream (KENALOG) 0.1 % Apply 1 application topically 2 (two) times daily. 10/30/16   Dietrich Pates, PA-C    Family History History reviewed. No pertinent family history.  Social History Social History  Substance Use Topics  . Smoking status: Never Smoker  . Smokeless tobacco: Never Used  . Alcohol use Yes     Comment: social     Allergies   Patient has no known allergies.   Review of Systems Review of  Systems  Constitutional: Negative for chills and fever.  HENT: Positive for rhinorrhea. Negative for drooling, facial swelling, sore throat and trouble swallowing.   Eyes: Positive for discharge and itching.  Respiratory: Negative for shortness of breath.   Cardiovascular: Negative for chest pain.  Gastrointestinal: Negative for nausea and vomiting.  Skin: Positive for rash.     Physical Exam Updated Vital Signs BP 107/64 (BP Location: Left Arm)   Pulse 70   Temp 98 F (36.7 C) (Oral)   Resp 16   Ht 5\' 7"  (1.702 m)   Wt 56.7 kg (125 lb)   SpO2 99%   BMI 19.58 kg/m   Physical Exam  Constitutional: He appears well-developed and well-nourished. No distress.  Nontoxic appearing and in no acute distress. No signs of airway compromise.  HENT:  Head: Normocephalic and atraumatic.  Eyes: Conjunctivae and EOM are normal. No scleral icterus.  Neck: Normal range of motion.  Pulmonary/Chest: Effort normal. No respiratory distress.  Neurological: He is alert.  Skin: Rash noted. He is not diaphoretic.  Small bumps on R eyelid. No angioedema. No blisters, no pustules, no warmth, no draining sinus tracts, no superficial abscesses, no bullous impetigo, no vesicles, no desquamation, no target lesions with dusky purpura or a central bulla.    Psychiatric: He has a normal mood and affect.  Nursing note and vitals reviewed.    ED Treatments / Results  Labs (all labs ordered are listed, but only abnormal  results are displayed) Labs Reviewed - No data to display  EKG  EKG Interpretation None       Radiology No results found.  Procedures Procedures (including critical care time)  Medications Ordered in ED Medications - No data to display   Initial Impression / Assessment and Plan / ED Course  I have reviewed the triage vital signs and the nursing notes.  Pertinent labs & imaging results that were available during my care of the patient were reviewed by me and considered in  my medical decision making (see chart for details).     Patient presents to ED for evaluation of rash on bilateral eyelids. He reports this feels similar to eczema flareups. He has tried hydrocortisone cream and Cetaphil eczema face wash with some improvement in his symptoms. On physical exam there are small bumps on right eyelid. He denies any vision changes, foreign body sensation or injuries. Pt has a patent airway without stridor and is handling secretions without difficulty; no angioedema. No blisters, no pustules, no warmth, no draining sinus tracts, no superficial abscesses, no bullous impetigo, no vesicles, no desquamation, no target lesions with dusky purpura or a central bulla. Not tender to touch. No concern for superimposed infection. No concern for SJS, TEN, TSS, tick borne illness, syphilis or other life-threatening condition.  We'll advise patient to take antihistamine such as Claritin or Zyrtec and triamcinolone cream to use in affected areas with caution to not get into eye. Advised to follow-up with dermatologist for further evaluation. Patient appears stable for discharge at this time. Strict return precautions given.  Final Clinical Impressions(s) / ED Diagnoses   Final diagnoses:  Eczema, unspecified type    New Prescriptions New Prescriptions   TRIAMCINOLONE CREAM (KENALOG) 0.1 %    Apply 1 application topically 2 (two) times daily.     Dietrich Pates, PA-C 10/30/16 1947    Raeford Razor, MD 11/08/16 [phone]

## 2016-10-30 NOTE — ED Triage Notes (Addendum)
Pt presents to ED for assessment of  bilteral eye rash x 2 months, burning with tears or touch.  Small bumps noted to the external eye.  No redness to eye.  Pt c/o swelling to his lower lid, crust when he wakes up, and burning.  Pt states he has tried eczema face wash, hydrocortisone cream, and a "home remedy" with no relief.

## 2017-01-15 IMAGING — CR DG CHEST 2V
2 series · 2 of 2 positions shown · non-contrast
Comparison: 03/02/2013.

CLINICAL DATA: Cough, fever, chills and body aches. Diarrhea for 3
days.

EXAM:
CHEST  2 VIEW

[chest pa]
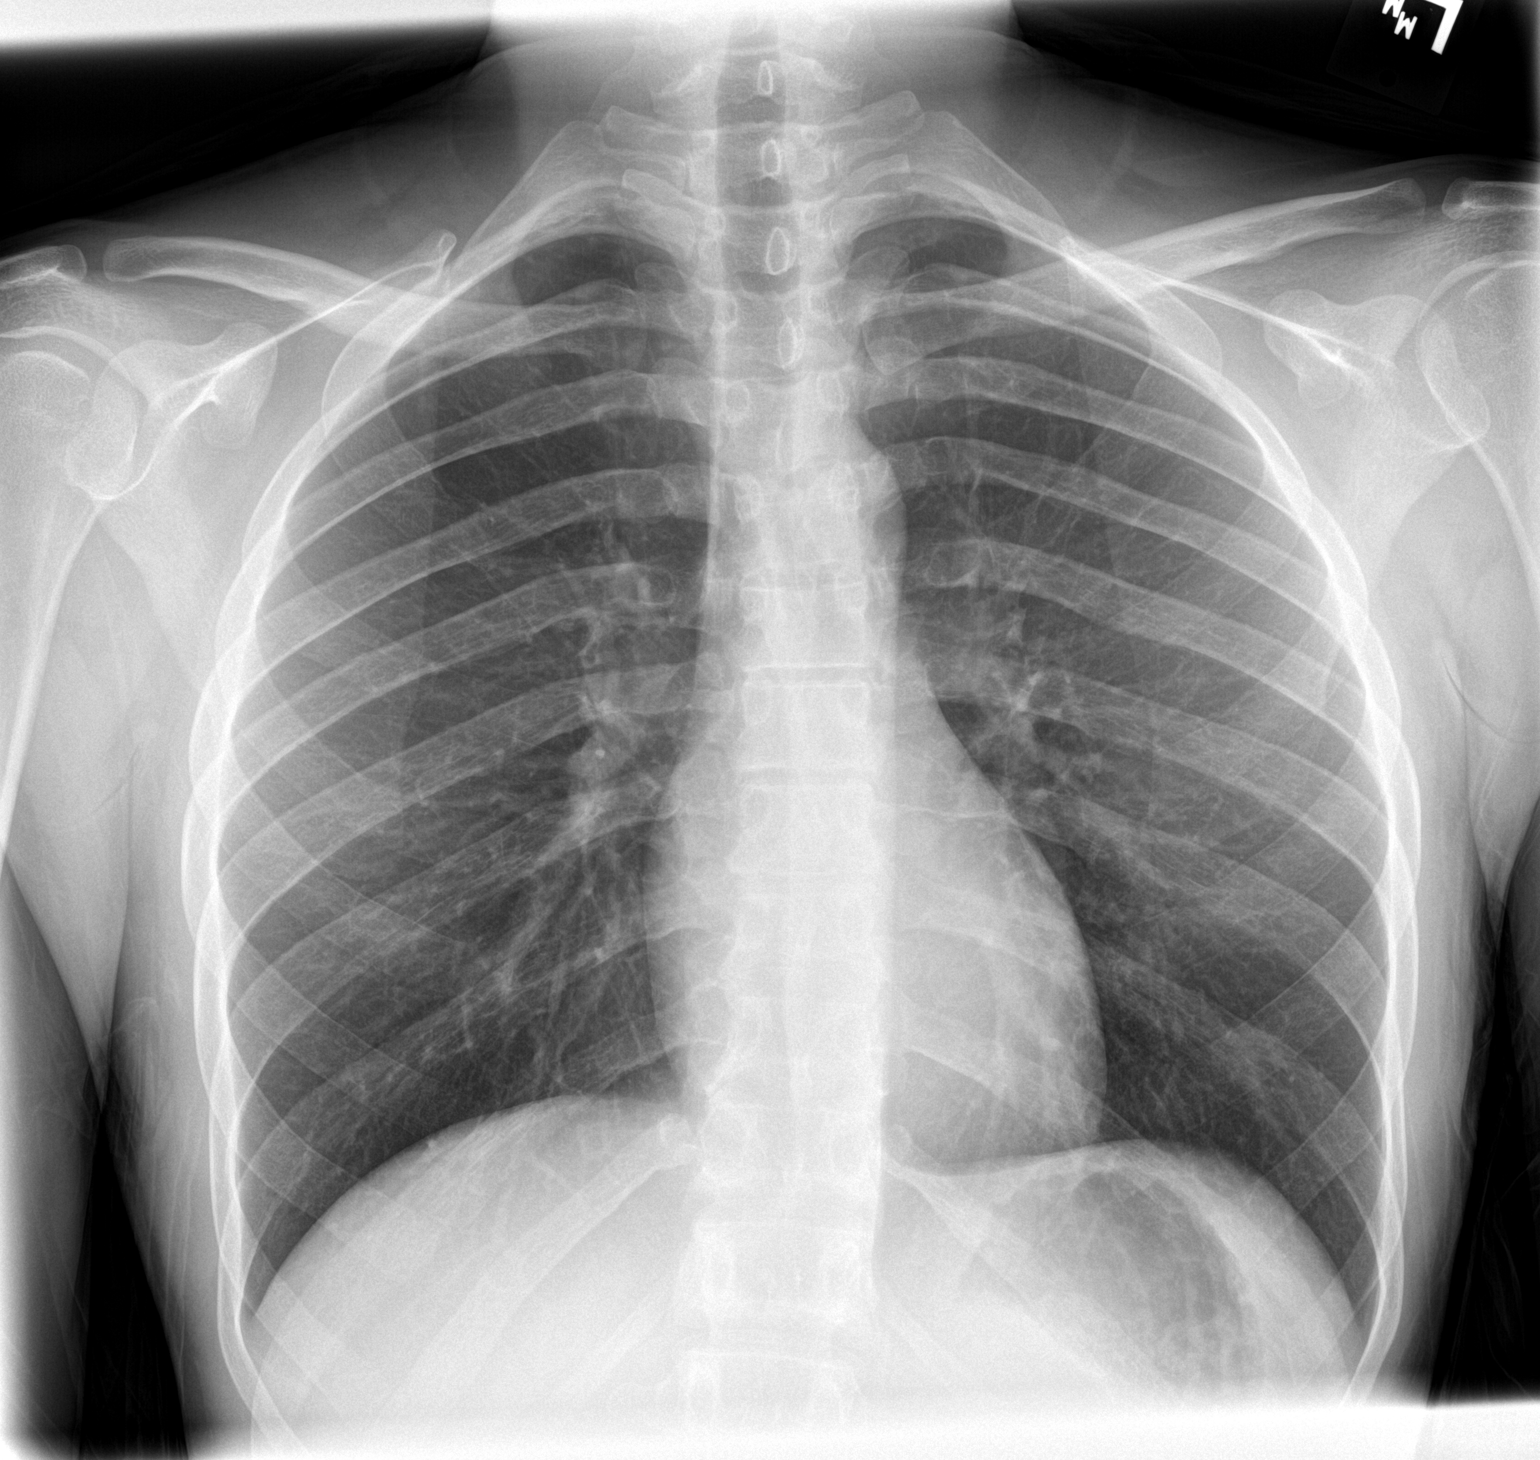

[chest lat]
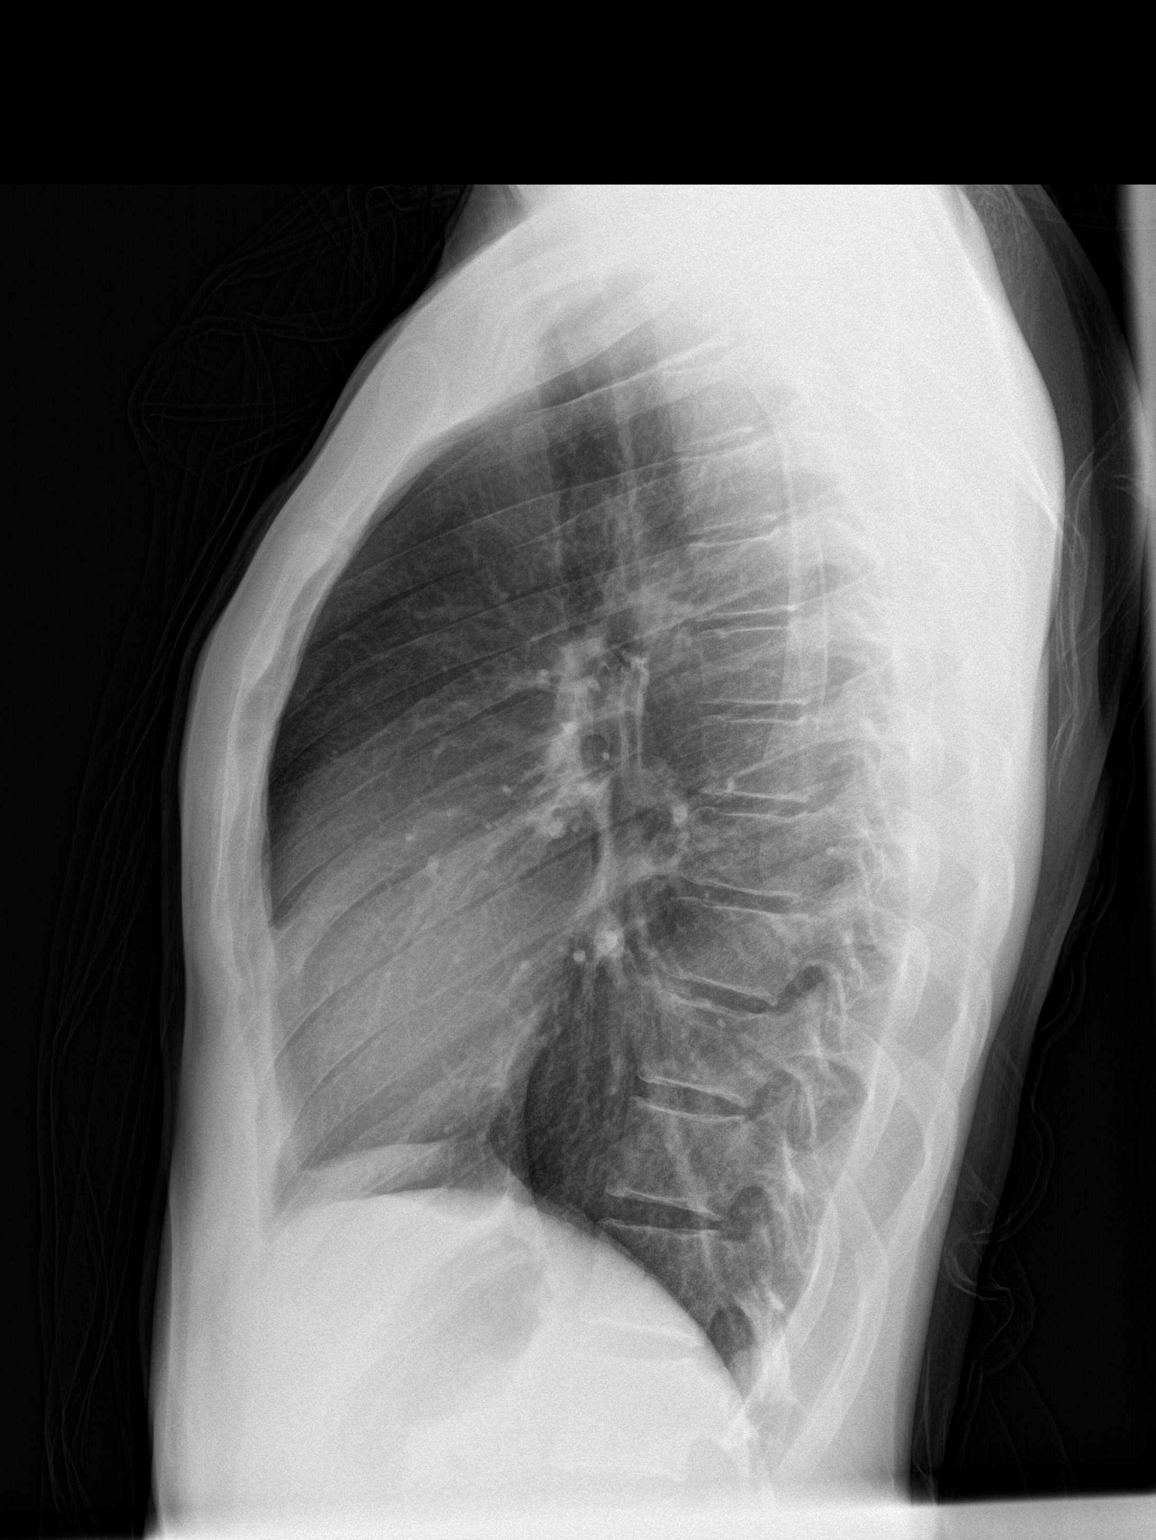

[2 of 2 positions shown; findings below may reference images not displayed]

FINDINGS: Normal heart, mediastinum and hila. Clear lungs. No pleural effusion
or pneumothorax. Bony thorax is unremarkable.
IMPRESSION: Normal chest radiographs.

## 2017-02-03 ENCOUNTER — Emergency Department (HOSPITAL_COMMUNITY): Payer: 59

## 2017-02-03 ENCOUNTER — Emergency Department (HOSPITAL_COMMUNITY)
Admission: EM | Admit: 2017-02-03 | Discharge: 2017-02-03 | Disposition: A | Discharge status: Home / Self Care | Payer: 59 | Attending: Emergency Medicine | Admitting: Emergency Medicine

## 2017-02-03 ENCOUNTER — Encounter (HOSPITAL_COMMUNITY): Payer: Self-pay | Admitting: Emergency Medicine

## 2017-02-03 ENCOUNTER — Other Ambulatory Visit: Payer: Self-pay

## 2017-02-03 DIAGNOSIS — B349 Viral infection, unspecified: Secondary | ICD-10-CM | POA: Diagnosis not present

## 2017-02-03 DIAGNOSIS — B9789 Other viral agents as the cause of diseases classified elsewhere: Secondary | ICD-10-CM

## 2017-02-03 DIAGNOSIS — J069 Acute upper respiratory infection, unspecified: Secondary | ICD-10-CM | POA: Insufficient documentation

## 2017-02-03 DIAGNOSIS — R05 Cough: Secondary | ICD-10-CM | POA: Diagnosis present

## 2017-02-03 LAB — CBC WITH DIFFERENTIAL/PLATELET
BASOS PCT: 0 %
Basophils Absolute: 0 10*3/uL (ref 0.0–0.1)
EOS ABS: 0.2 10*3/uL (ref 0.0–0.7)
Eosinophils Relative: 3 %
HEMATOCRIT: 42.3 % (ref 39.0–52.0)
Hemoglobin: 13.8 g/dL (ref 13.0–17.0)
LYMPHS ABS: 2.2 10*3/uL (ref 0.7–4.0)
Lymphocytes Relative: 42 %
MCH: 27.5 pg (ref 26.0–34.0)
MCHC: 32.6 g/dL (ref 30.0–36.0)
MCV: 84.4 fL (ref 78.0–100.0)
MONO ABS: 0.6 10*3/uL (ref 0.1–1.0)
MONOS PCT: 11 %
Neutro Abs: 2.3 10*3/uL (ref 1.7–7.7)
Neutrophils Relative %: 44 %
Platelets: 232 10*3/uL (ref 150–400)
RBC: 5.01 MIL/uL (ref 4.22–5.81)
RDW: 13 % (ref 11.5–15.5)
WBC: 5.2 10*3/uL (ref 4.0–10.5)

## 2017-02-03 LAB — BASIC METABOLIC PANEL
ANION GAP: 7 (ref 5–15)
BUN: 20 mg/dL (ref 6–20)
CHLORIDE: 105 mmol/L (ref 101–111)
CO2: 26 mmol/L (ref 22–32)
Calcium: 9.6 mg/dL (ref 8.9–10.3)
Creatinine, Ser: 0.87 mg/dL (ref 0.61–1.24)
GFR calc Af Amer: >60 mL/min (ref >60)
GFR calc non Af Amer: >60 mL/min (ref >60)
GLUCOSE: 87 mg/dL (ref 65–99)
POTASSIUM: 3.9 mmol/L (ref 3.5–5.1)
Sodium: 138 mmol/L (ref 135–145)

## 2017-02-03 MED ORDER — ACETAMINOPHEN 500 MG PO TABS
1000.0000 mg | ORAL_TABLET | Freq: Once | ORAL | Status: AC
  Administered 2017-02-03: 1000 mg via ORAL
  Filled 2017-02-03: qty 2

## 2017-02-03 MED ORDER — ONDANSETRON 4 MG PO TBDP
4.0000 mg | ORAL_TABLET | Freq: Once | ORAL | Status: AC
  Administered 2017-02-03: 4 mg via ORAL
  Filled 2017-02-03: qty 1

## 2017-02-03 MED ORDER — FLUTICASONE PROPIONATE 50 MCG/ACT NA SUSP: 2.0000 | Freq: Every day | NASAL | 0 refills | Status: DC

## 2017-02-03 MED ORDER — BENZONATATE 100 MG PO CAPS: 100.0000 mg | ORAL_CAPSULE | Freq: Three times a day (TID) | ORAL | 0 refills | Status: DC

## 2017-02-03 NOTE — Discharge Instructions (Signed)
Your chest x-ray today looked normal without any sign of pneumonia or fluid.  Take Tessalon for cough as prescribed.  Use Flonase nasal spray for nasal congestion.  Drink plenty of fluids and get plenty of rest. Alternate 600 mg of ibuprofen and [phone] mg of Tylenol every 3 hours as needed for pain and fever. Do not exceed 4000 mg of Tylenol daily.  Use warm water salt gargles, throat lozenges, and over-the-counter sprays for relief of your sore throat.  Follow-up with a primary care physician for reevaluation of your symptoms.  Return to the ED immediately for any concerning signs or symptoms develop such as throat tightness or drooling, fever that is not controlled by ibuprofen or Tylenol, chest pains, or shortness of breath.

## 2017-02-03 NOTE — ED Triage Notes (Signed)
Pt having running nose, ear and body ache since Tuesday, no fever or chills.

## 2017-02-03 NOTE — ED Provider Notes (Signed)
[facility]   CSN: [id number] Arrival date & time: 02/03/17  0253     History   Chief Complaint Chief Complaint  Patient presents with  . cold symptoms    HPI Donald Baird is a 31 y.o. male with no significant past medical history who presents today with chief complaint acute onset, progressively worsening cough and nasal congestion for 4 days.  He endorses cough productive of yellow-green mucus, as well as intermittent nasal congestion, sore throat, myalgias, and fevers and chills.  Has had some episodes of posttussive emesis.  Denies drooling or difficulty swallowing.  Has tolerated p.o. food and fluids without difficulty.  States he had a temperature of 100.2 F at home yesterday evening.  He also had a frontal headache on Wednesday which improved and has resolved entirely.  He has tried over-the-counter lozenges and warm teas with mild improvement.  He is a non-smoker.  The history is provided by the patient.    History reviewed. No pertinent past medical history.  There are no active problems to display for this patient.   History reviewed. No pertinent surgical history.     Home Medications    Prior to Admission medications   Medication Sig Start Date End Date Taking? Authorizing Provider  benzonatate (TESSALON) 100 MG capsule Take 1 capsule (100 mg total) by mouth every 8 (eight) hours. 02/03/17   Nile Dorning A, PA-C  fluticasone (FLONASE) 50 MCG/ACT nasal spray Place 2 sprays into both nostrils daily. 02/03/17   Jeanie SewerFawze, Veretta Sabourin A, PA-C    Family History No family history on file.  Social History Social History   Tobacco Use  . Smoking status: Never Smoker  . Smokeless tobacco: Never Used  Substance Use Topics  . Alcohol use: Yes    Comment: social  . Drug use: No     Allergies   Patient has no known allergies.   Review of Systems Review of Systems  Constitutional: Positive for chills and  fever.  HENT: Positive for congestion, rhinorrhea and sore throat. Negative for ear pain, facial swelling, sinus pressure, sinus pain and trouble swallowing.   Respiratory: Positive for cough and chest tightness. Negative for shortness of breath.   Cardiovascular: Negative for chest pain, palpitations and leg swelling.  Gastrointestinal: Positive for nausea and vomiting. Negative for abdominal pain.  All other systems reviewed and are negative.    Physical Exam Updated Vital Signs BP 113/88   Pulse 65   Temp 98.3 F (36.8 C) (Oral)   Resp 18   Ht 5\' 7"  (1.702 m)   Wt 56.7 kg (125 lb)   SpO2 100%   BMI 19.58 kg/m   Physical Exam  Constitutional: He appears well-developed and well-nourished. No distress.  HENT:  Head: Normocephalic and atraumatic.  Right Ear: External ear normal.  Left Ear: External ear normal.  TMs with middle ear effusion bilaterally.  No bulging or erythema.  Nasal septum is midline with pink mucosa and mild nasal congestion bilaterally.  Posterior oropharynx with mild erythema with no tonsillar hypertrophy, exudates, or uvular deviation.  No trismus or sublingual abnormalities  Eyes: Conjunctivae and EOM are normal. Pupils are equal, round, and reactive to light. Right eye exhibits no discharge. Left eye exhibits no discharge.  Neck: Normal range of motion. Neck supple. No JVD present. No tracheal deviation present.  No meningeal signs  Cardiovascular: Normal rate, regular rhythm, normal heart sounds and intact distal pulses.  Pulmonary/Chest: Effort normal  and breath sounds normal. No stridor. No respiratory distress. He has no wheezes. He has no rales. He exhibits no tenderness.  Abdominal: Soft. Bowel sounds are normal. He exhibits no distension. There is no tenderness.  Musculoskeletal: He exhibits no edema.  Lymphadenopathy:    He has no cervical adenopathy.  Neurological: He is alert.  Skin: Skin is warm and dry. No erythema.  Psychiatric: He has a  normal mood and affect. His behavior is normal.  Nursing note and vitals reviewed.    ED Treatments / Results  Labs (all labs ordered are listed, but only abnormal results are displayed) Labs Reviewed  CBC WITH DIFFERENTIAL/PLATELET  BASIC METABOLIC PANEL    EKG  EKG Interpretation None       Radiology Dg Chest 2 View  Result Date: 02/03/2017 CLINICAL DATA:  Sore throat.  Dry cough. EXAM: CHEST  2 VIEW COMPARISON:  04/01/2014 . FINDINGS: Mediastinum and hilar structures are normal . Heart size normal. No focal infiltrate. No pleural effusion or pneumothorax. Mild thoracic spine scoliosis. No acute bony abnormality . IMPRESSION: No acute cardiopulmonary disease.  Chest is stable from prior exam . Electronically Signed   By: Maisie Fushomas  Register   On: 02/03/2017 07:55    Procedures Procedures (including critical care time)  Medications Ordered in ED Medications  ondansetron (ZOFRAN-ODT) disintegrating tablet 4 mg (4 mg Oral Given 02/03/17 0708)  acetaminophen (TYLENOL) tablet 1,000 mg (1,000 mg Oral Given 02/03/17 0708)     Initial Impression / Assessment and Plan / ED Course  I have reviewed the triage vital signs and the nursing notes.  Pertinent labs & imaging results that were available during my care of the patient were reviewed by me and considered in my medical decision making (see chart for details).     Patient afebrile, vital signs are stable. Pt CXR negative for acute infiltrate. Patients symptoms are consistent with URI, likely viral etiology.  Examination of the posterior pharynx not concerning for strep pharyngitis, PTA, or spread of infection to soft tissue.  No fever or meningeal signs to suggest meningitis.  Discussed that antibiotics are not indicated for viral infections. Pt will be discharged with symptomatic treatment. He will follow-up with primary care physician if symptoms persist.  Discussed indications for return to the ED.  Pt is hemodynamically stable  & in NAD prior to dc. Pt verbalized understanding of and agreement with plan and is safe for discharge home at this time.  Final Clinical Impressions(s) / ED Diagnoses   Final diagnoses:  Viral URI with cough    ED Discharge Orders        Ordered    benzonatate (TESSALON) 100 MG capsule  Every 8 hours     02/03/17 0803    fluticasone (FLONASE) 50 MCG/ACT nasal spray  Daily     02/03/17 0803       Jeanie SewerFawze, Alyssa Rotondo A, PA-C 02/03/17 0809    Glynn Octaveancour, Stephen, MD 02/03/17 [phone]

## 2018-03-12 ENCOUNTER — Ambulatory Visit
Admission: EM | Admit: 2018-03-12 | Discharge: 2018-03-12 | Disposition: A | Discharge status: Home / Self Care | Payer: BLUE CROSS/BLUE SHIELD | Attending: Family Medicine | Admitting: Family Medicine

## 2018-03-12 DIAGNOSIS — J069 Acute upper respiratory infection, unspecified: Secondary | ICD-10-CM | POA: Insufficient documentation

## 2018-03-12 DIAGNOSIS — R509 Fever, unspecified: Secondary | ICD-10-CM

## 2018-03-12 DIAGNOSIS — R05 Cough: Secondary | ICD-10-CM | POA: Diagnosis not present

## 2018-03-12 DIAGNOSIS — B9789 Other viral agents as the cause of diseases classified elsewhere: Secondary | ICD-10-CM | POA: Insufficient documentation

## 2018-03-12 DIAGNOSIS — R5383 Other fatigue: Secondary | ICD-10-CM

## 2018-03-12 DIAGNOSIS — J029 Acute pharyngitis, unspecified: Secondary | ICD-10-CM

## 2018-03-12 LAB — POCT INFLUENZA A/B
INFLUENZA A, POC: NEGATIVE
Influenza B, POC: NEGATIVE

## 2018-03-12 NOTE — Discharge Instructions (Addendum)
The flu test we did here was negative This is most likely a viral upper respiratory infection You can do Tylenol or ibuprofen for the body aches Mucinex for cough and congestion Benadryl to help you sleep at night Follow up as needed for continued or worsening symptoms

## 2018-03-12 NOTE — ED Triage Notes (Signed)
Pt c/o flu like sx's since last night, fever, bodyaches, sore throat, dry cough and fatigue

## 2018-03-14 NOTE — ED Provider Notes (Signed)
[facility]    CSN: [id number] Arrival date & time: 03/12/18  1454     History   Chief Complaint Chief Complaint  Patient presents with  . Generalized Body Aches    HPI Donald Baird is a 33 y.o. male.   Pt is a 33 year old male that presents with flu like symptoms x 1 days. He is complaining of fever, body aches, chills, sore throat, fatigue and dry cough. Symptoms have been present and remained the same. He has not taken anything for the symptoms. No recent traveling or sick contacts. Slight nausea. No vomiting or diarrhea. He does not smoke.   ROS per HPI      History reviewed. No pertinent past medical history.  There are no active problems to display for this patient.   History reviewed. No pertinent surgical history.     Home Medications    Prior to Admission medications   Not on File    Family History No family history on file.  Social History Social History   Tobacco Use  . Smoking status: Never Smoker  . Smokeless tobacco: Never Used  Substance Use Topics  . Alcohol use: Yes    Comment: social  . Drug use: No     Allergies   Patient has no known allergies.   Review of Systems Review of Systems   Physical Exam Triage Vital Signs ED Triage Vitals  Enc Vitals Group     BP 03/12/18 1633 110/61     Pulse Rate 03/12/18 1633 87     Resp 03/12/18 1633 16     Temp 03/12/18 1633 97.9 F (36.6 C)     Temp Source 03/12/18 1633 Oral     SpO2 03/12/18 1633 98 %     Weight --      Height --      Head Circumference --      Peak Flow --      Pain Score 03/12/18 1634 6     Pain Loc --      Pain Edu? --      Excl. in GC? --    No data found.  Updated Vital Signs BP 110/61 (BP Location: Left Arm)   Pulse 87   Temp 97.9 F (36.6 C) (Oral)   Resp 16   SpO2 98%   Visual Acuity Right Eye Distance:   Left Eye Distance:   Bilateral Distance:    Right Eye Near:   Left Eye Near:    Bilateral Near:     Physical  Exam Vitals signs and nursing note reviewed.  Constitutional:      General: He is not in acute distress.    Appearance: Normal appearance. He is not ill-appearing, toxic-appearing or diaphoretic.  HENT:     Head: Normocephalic and atraumatic.     Right Ear: Tympanic membrane, ear canal and external ear normal.     Left Ear: Ear canal and external ear normal.     Nose: Nose normal.     Mouth/Throat:     Pharynx: Oropharynx is clear. Posterior oropharyngeal erythema present.  Eyes:     Conjunctiva/sclera: Conjunctivae normal.  Neck:     Musculoskeletal: Normal range of motion.  Cardiovascular:     Rate and Rhythm: Normal rate and regular rhythm.     Heart sounds: Normal heart sounds.  Pulmonary:     Effort: Pulmonary effort is normal.     Breath sounds: Normal breath sounds.  Musculoskeletal: Normal  range of motion.  Lymphadenopathy:     Cervical: No cervical adenopathy.  Skin:    General: Skin is warm and dry.  Neurological:     Mental Status: He is alert.  Psychiatric:        Mood and Affect: Mood normal.      UC Treatments / Results  Labs (all labs ordered are listed, but only abnormal results are displayed) Labs Reviewed  POCT INFLUENZA A/B    EKG None  Radiology No results found.  Procedures Procedures (including critical care time)  Medications Ordered in UC Medications - No data to display  Initial Impression / Assessment and Plan / UC Course  I have reviewed the triage vital signs and the nursing notes.  Pertinent labs & imaging results that were available during my care of the patient were reviewed by me and considered in my medical decision making (see chart for details).     Flu test was negative Most likely viral URI Symptomatic treatment Follow up as needed for continued or worsening symptoms  Final Clinical Impressions(s) / UC Diagnoses   Final diagnoses:  Viral URI with cough     Discharge Instructions     The flu test we did  here was negative This is most likely a viral upper respiratory infection You can do Tylenol or ibuprofen for the body aches Mucinex for cough and congestion Benadryl to help you sleep at night Follow up as needed for continued or worsening symptoms     ED Prescriptions    None     Controlled Substance Prescriptions  Controlled Substance Registry consulted? no   Janace ArisBast, Liesl Simons A, NP 03/14/18 1857

## 2021-02-10 ENCOUNTER — Other Ambulatory Visit: Payer: Self-pay

## 2021-02-10 ENCOUNTER — Ambulatory Visit (HOSPITAL_COMMUNITY): Admission: EM | Admit: 2021-02-10 | Discharge: 2021-02-10 | Discharge status: Against Medical Advice | Payer: Self-pay

## 2021-02-11 ENCOUNTER — Ambulatory Visit (HOSPITAL_COMMUNITY)
Admission: EM | Admit: 2021-02-11 | Discharge: 2021-02-11 | Disposition: A | Discharge status: Home / Self Care | Payer: BC Managed Care – PPO | Attending: Emergency Medicine | Admitting: Emergency Medicine

## 2021-02-11 ENCOUNTER — Encounter (HOSPITAL_COMMUNITY): Payer: Self-pay

## 2021-02-11 ENCOUNTER — Other Ambulatory Visit: Payer: Self-pay

## 2021-02-11 VITALS — BP 120/76 | HR 75 | Temp 98.8°F | Resp 18

## 2021-02-11 DIAGNOSIS — J069 Acute upper respiratory infection, unspecified: Secondary | ICD-10-CM

## 2021-02-11 MED ORDER — IBUPROFEN 800 MG PO TABS: 800.0000 mg | ORAL_TABLET | Freq: Three times a day (TID) | ORAL | 0 refills | Status: DC

## 2021-02-11 MED ORDER — CYCLOBENZAPRINE HCL 10 MG PO TABS: 10.0000 mg | ORAL_TABLET | Freq: Every day | ORAL | 0 refills | Status: DC

## 2021-02-11 NOTE — ED Provider Notes (Signed)
[facility]    CSN: [id number] Arrival date & time: 02/11/21  1652      History   Chief Complaint Chief Complaint  Patient presents with   Appointment    17:00   Cough    HPI Donald Baird is a 35 y.o. male.   Patient presents with chills, nasal congestion, rhinorrhea, sore throat and nonproductive cough for 4 days. Attempted use of vicks cold and flu, somewhat helpful. No known sick contacts. Home covid test negative.  Poor appetite but tolerating fluids.  No pertinent medical history.  History reviewed. No pertinent past medical history.  There are no problems to display for this patient.   History reviewed. No pertinent surgical history.     Home Medications    Prior to Admission medications   Not on File    Family History Family History  Problem Relation Age of Onset   Healthy Mother     Social History Social History   Tobacco Use   Smoking status: Never   Smokeless tobacco: Never  Vaping Use   Vaping Use: Never used  Substance Use Topics   Alcohol use: Yes    Comment: social   Drug use: No     Allergies   Patient has no known allergies.   Review of Systems Review of Systems  Constitutional:  Positive for chills. Negative for activity change, appetite change, diaphoresis, fatigue, fever and unexpected weight change.  HENT:  Positive for congestion, rhinorrhea and sore throat. Negative for dental problem, drooling, ear discharge, ear pain, facial swelling, hearing loss, mouth sores, nosebleeds, postnasal drip, sinus pressure, sinus pain, sneezing, tinnitus, trouble swallowing and voice change.   Respiratory:  Positive for cough. Negative for apnea, choking, chest tightness, shortness of breath, wheezing and stridor.   Cardiovascular: Negative.   Gastrointestinal: Negative.   Skin: Negative.   Neurological:  Negative for dizziness, tremors, seizures, syncope, facial asymmetry, speech difficulty, weakness, light-headedness,  numbness and headaches.    Physical Exam Triage Vital Signs ED Triage Vitals  Enc Vitals Group     BP 02/11/21 1724 120/76     Pulse Rate 02/11/21 1724 75     Resp 02/11/21 1724 18     Temp 02/11/21 1724 98.8 F (37.1 C)     Temp Source 02/11/21 1724 Oral     SpO2 02/11/21 1724 99 %     Weight --      Height --      Head Circumference --      Peak Flow --      Pain Score 02/11/21 1722 7     Pain Loc --      Pain Edu? --      Excl. in GC? --    No data found.  Updated Vital Signs BP 120/76 (BP Location: Left Arm)   Pulse 75   Temp 98.8 F (37.1 C) (Oral)   Resp 18   SpO2 99%   Visual Acuity Right Eye Distance:   Left Eye Distance:   Bilateral Distance:    Right Eye Near:   Left Eye Near:    Bilateral Near:     Physical Exam Constitutional:      Appearance: Normal appearance. He is normal weight.  HENT:     Head: Normocephalic.     Right Ear: Tympanic membrane, ear canal and external ear normal.     Left Ear: Tympanic membrane, ear canal and external ear normal.     Nose: Congestion and  rhinorrhea present.     Mouth/Throat:     Mouth: Mucous membranes are moist.     Pharynx: Posterior oropharyngeal erythema present.  Eyes:     Extraocular Movements: Extraocular movements intact.  Cardiovascular:     Rate and Rhythm: Normal rate and regular rhythm.     Pulses: Normal pulses.     Heart sounds: Normal heart sounds.  Pulmonary:     Effort: Pulmonary effort is normal.     Breath sounds: Normal breath sounds.  Musculoskeletal:        General: Normal range of motion.     Cervical back: Normal range of motion and neck supple.  Skin:    General: Skin is warm and dry.  Neurological:     Mental Status: He is alert and oriented to person, place, and time. Mental status is at baseline.  Psychiatric:        Mood and Affect: Mood normal.        Behavior: Behavior normal.     UC Treatments / Results  Labs (all labs ordered are listed, but only abnormal  results are displayed) Labs Reviewed - No data to display  EKG   Radiology No results found.  Procedures Procedures (including critical care time)  Medications Ordered in UC Medications - No data to display  Initial Impression / Assessment and Plan / UC Course  I have reviewed the triage vital signs and the nursing notes.  Pertinent labs & imaging results that were available during my care of the patient were reviewed by me and considered in my medical decision making (see chart for details).  Viral URI with cough  1.  Ibuprofen 800 mg 3 times daily as needed 2.  Flexeril 10 mg at bedtime as needed 3.  Over-the-counter medication for remaining symptom management 4.  Urgent care follow-up as needed Final Clinical Impressions(s) / UC Diagnoses   Final diagnoses:  None   Discharge Instructions   None    ED Prescriptions   None    PDMP not reviewed this encounter.   Valinda Hoar, NP 02/11/21 1750

## 2021-02-11 NOTE — ED Triage Notes (Addendum)
02/09/2021.  Home covid test read negative.  Patient  has a cough, general aches and soreness, chills, sore throat, and body aches

## 2021-02-11 NOTE — Discharge Instructions (Addendum)

## 2021-04-15 ENCOUNTER — Other Ambulatory Visit: Payer: Self-pay

## 2021-04-15 ENCOUNTER — Encounter (HOSPITAL_COMMUNITY): Payer: Self-pay

## 2021-04-15 ENCOUNTER — Ambulatory Visit (HOSPITAL_COMMUNITY)
Admission: EM | Admit: 2021-04-15 | Discharge: 2021-04-15 | Disposition: A | Discharge status: Home / Self Care | Payer: BC Managed Care – PPO | Attending: Family Medicine | Admitting: Family Medicine

## 2021-04-15 DIAGNOSIS — M25532 Pain in left wrist: Secondary | ICD-10-CM

## 2021-04-15 MED ORDER — PREDNISONE 20 MG PO TABS: 40.0000 mg | ORAL_TABLET | Freq: Every day | ORAL | 0 refills | Status: DC

## 2021-04-15 MED ORDER — DICLOFENAC SODIUM 75 MG PO TBEC: 75.0000 mg | DELAYED_RELEASE_TABLET | Freq: Two times a day (BID) | ORAL | 0 refills | Status: DC

## 2021-04-15 NOTE — ED Triage Notes (Signed)
Pt c/o lt wrist pain x1wk, denies known injury. 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 Denies taking meds for pain.

## 2021-04-15 NOTE — ED Provider Notes (Signed)
[facility]   [id number] 04/15/21 Arrival Time: 1335  ASSESSMENT & PLAN:  1. Left wrist pain    No indication for plain imaging at this time.  Trial of: Discharge Medication List as of 04/15/2021  3:15 PM     START taking these medications   Details  diclofenac (VOLTAREN) 75 MG EC tablet Take 1 tablet (75 mg total) by mouth 2 (two) times daily., Starting Thu 04/15/2021, Normal    predniSONE (DELTASONE) 20 MG tablet Take 2 tablets (40 mg total) by mouth daily., Starting Thu 04/15/2021, Normal       Orders Placed This Encounter  Procedures   Apply Wrist brace   Work note provided.  Recommend:  Follow-up Information     [facility].   Why: If worsening or failing to improve as anticipated. Contact information: [address] [place] [address] [phone]               Reviewed expectations re: course of current medical issues. Questions answered. Outlined signs and symptoms indicating need for more acute intervention. Patient verbalized understanding. After Visit Summary given.  SUBJECTIVE: History from: patient. GEVORG BRUM is a 36 y.o. male who reports fairly persistent pain of his left wrist; described as aching; without radiation. Onset: gradual. First noted:  approx 1.5 w ago . Injury/trama: denied. 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 Frequent repetitive motions of hands with lifting. Symptoms have gradually worsened since beginning. Aggravating factors: certain movements. Alleviating factors: have not been identified. Associated symptoms: none reported. Extremity sensation changes or weakness: none. No tx PTA. History of similar: no.  History reviewed. No pertinent surgical history.    OBJECTIVE:  Vitals:   04/15/21 1432  BP: 119/63  Pulse: 67  Resp: 18  Temp: 98.6 F (37 C)  TempSrc: Oral  SpO2: 99%    General appearance: alert; no distress HEENT: [place];  AT Neck: supple with FROM Resp: unlabored respirations Extremities: LUE: warm with well perfused appearance; poorly localized mild to moderate soreness described over left wrist; without gross deformities; swelling: none; bruising: none; wrist ROM: normal CV: brisk extremity capillary refill of LUE; 2+ radial pulse of LUE. Skin: warm and dry; no visible rashes Neurologic: gait normal; normal sensation and strength of LUE Psychological: alert and cooperative; normal mood and affect   No Known Allergies  History reviewed. No pertinent past medical history. Social History   Socioeconomic History   Marital status: Married    Spouse name: Not on file   Number of children: Not on file   Years of education: Not on file   Highest education level: Not on file  Occupational History   Not on file  Tobacco Use   Smoking status: Never   Smokeless tobacco: Never  Vaping Use   Vaping Use: Never used  Substance and Sexual Activity   Alcohol use: Yes    Comment: social   Drug use: No   Sexual activity: Not on file  Other Topics Concern   Not on file  Social History Narrative   Not on file   Social Determinants of Health   Financial Resource Strain: Not on file  Food Insecurity: Not on file  Transportation Needs: Not on file  Physical Activity: Not on file  Stress: Not on file  Social Connections: Not on file   Family History  Problem Relation Age of Onset   Healthy Mother    History reviewed. No pertinent surgical  history.     Mardella Layman, MD 04/15/21 [phone]

## 2023-09-19 ENCOUNTER — Ambulatory Visit (HOSPITAL_COMMUNITY)
Admission: RE | Admit: 2023-09-19 | Discharge: 2023-09-19 | Disposition: A | Discharge status: Home / Self Care | Source: Ambulatory Visit | Attending: Emergency Medicine | Admitting: Emergency Medicine

## 2023-09-19 ENCOUNTER — Encounter (HOSPITAL_COMMUNITY): Payer: Self-pay

## 2023-09-19 VITALS — BP 107/70 | HR 71 | Temp 98.1°F | Resp 18

## 2023-09-19 DIAGNOSIS — M778 Other enthesopathies, not elsewhere classified: Secondary | ICD-10-CM

## 2023-09-19 DIAGNOSIS — M25531 Pain in right wrist: Secondary | ICD-10-CM

## 2023-09-19 NOTE — ED Provider Notes (Signed)
 [facility]    CSN: [id number] Arrival date & time: 09/19/23  1254      History   Chief Complaint Chief Complaint  Patient presents with   Wrist Pain    Entered by patient    HPI Donald ZWART is a 38 y.o. male.  Wrist pain since yesterday after moving boxes No direct injury or trauma. Reports started getting sore during movement. Overnight still hurting and then today at work when using the wrist. Feels with certain motions No intervention yet Denies prior injury  History reviewed. No pertinent past medical history.  There are no active problems to display for this patient.   History reviewed. No pertinent surgical history.     Home Medications    Prior to Admission medications   Not on File    Family History Family History  Problem Relation Age of Onset   Healthy Mother     Social History Social History   Tobacco Use   Smoking status: Never   Smokeless tobacco: Never  Vaping Use   Vaping status: Never Used  Substance Use Topics   Alcohol use: Yes    Comment: social   Drug use: No     Allergies   Patient has no known allergies.   Review of Systems Review of Systems As per HPI  Physical Exam Triage Vital Signs ED Triage Vitals  Encounter Vitals Group     BP 09/19/23 1322 107/70     Girls Systolic BP Percentile --      Girls Diastolic BP Percentile --      Boys Systolic BP Percentile --      Boys Diastolic BP Percentile --      Pulse Rate 09/19/23 1322 71     Resp 09/19/23 1322 18     Temp 09/19/23 1322 98.1 F (36.7 C)     Temp Source 09/19/23 1322 Oral     SpO2 09/19/23 1322 98 %     Weight --      Height --      Head Circumference --      Peak Flow --      Pain Score 09/19/23 1321 5     Pain Loc --      Pain Education --      Exclude from Growth Chart --    No data found.  Updated Vital Signs BP 107/70 (BP Location: Left Arm)   Pulse 71   Temp 98.1 F (36.7 C) (Oral)   Resp 18   SpO2 98%     Physical Exam Vitals and nursing note reviewed.  Constitutional:      General: He is not in acute distress. HENT:     Mouth/Throat:     Pharynx: Oropharynx is clear.  Cardiovascular:     Rate and Rhythm: Normal rate and regular rhythm.     Pulses: Normal pulses.  Pulmonary:     Effort: Pulmonary effort is normal.  Musculoskeletal:        General: Normal range of motion.     Right wrist: No swelling, deformity or bony tenderness. Normal range of motion (pain with movement). Normal pulse.     Right hand: Normal. No bony tenderness.     Comments: Full ROM despite pain. No bony tenderness. Grip strength intact. Sensation normal, cap refill fingers < 2 seconds. Radial pulse 2+. No obvious swelling or deformity.   Skin:    General: Skin is warm and dry.     Capillary  Refill: Capillary refill takes less than 2 seconds.  Neurological:     Mental Status: He is alert and oriented to person, place, and time.     UC Treatments / Results  Labs (all labs ordered are listed, but only abnormal results are displayed) Labs Reviewed - No data to display  EKG   Radiology No results found.  Procedures Procedures (including critical care time)  Medications Ordered in UC Medications - No data to display  Initial Impression / Assessment and Plan / UC Course  I have reviewed the triage vital signs and the nursing notes.  Pertinent labs & imaging results that were available during my care of the patient were reviewed by me and considered in my medical decision making (see chart for details).  Low chance of bony abnormality given mechanism of injury.  I would suspect tendonitis or sprain.  I have offered x-ray to rule out fracture, however shared decision making we will defer at this time.  A wrist brace is provided for support.  Discussed RICE therapy and pain control.  Following up with orthopedics as recommended if symptoms are not improving despite these therapies. A note for work is  provided  Final Clinical Impressions(s) / UC Diagnoses   Final diagnoses:  Right wrist tendonitis  Acute pain of right wrist     Discharge Instructions      Rest - try to avoid heavy lifting and high impact activity Ice - apply for 20 minutes a few times daily Compression - use wrist brace for support Elevation - prop up on a pillow Ibuprofen  -- up to 800 mg every 6 hours for pain and swelling  Follow with orthopedics if needed     ED Prescriptions   None    PDMP not reviewed this encounter.   Jeryl Stabs, [place] 09/19/23 1348

## 2023-09-19 NOTE — Discharge Instructions (Addendum)
 Rest - try to avoid heavy lifting and high impact activity Ice - apply for 20 minutes a few times daily Compression - use wrist brace for support Elevation - prop up on a pillow Ibuprofen  -- up to 800 mg every 6 hours for pain and swelling  Follow with orthopedics if needed

## 2023-09-19 NOTE — ED Triage Notes (Signed)
 Pt c/o rt wrist pain on movement. Denies known injury but did heavy lifting yesterday. Denies taken any meds.

## 2023-12-20 ENCOUNTER — Ambulatory Visit
Admission: RE | Admit: 2023-12-20 | Discharge: 2023-12-20 | Disposition: A | Discharge status: Home / Self Care | Payer: Self-pay | Source: Ambulatory Visit | Attending: Family Medicine | Admitting: Family Medicine

## 2023-12-20 ENCOUNTER — Ambulatory Visit: Payer: Self-pay

## 2023-12-20 ENCOUNTER — Other Ambulatory Visit: Payer: Self-pay

## 2023-12-20 VITALS — BP 117/76 | HR 62 | Temp 98.0°F | Resp 16

## 2023-12-20 DIAGNOSIS — M545 Low back pain, unspecified: Secondary | ICD-10-CM

## 2023-12-20 DIAGNOSIS — R300 Dysuria: Secondary | ICD-10-CM

## 2023-12-20 LAB — POCT URINE DIPSTICK
Bilirubin, UA: NEGATIVE
Blood, UA: NEGATIVE
Glucose, UA: NEGATIVE mg/dL
Ketones, POC UA: NEGATIVE mg/dL
Leukocytes, UA: NEGATIVE
Nitrite, UA: NEGATIVE
POC PROTEIN,UA: NEGATIVE
Spec Grav, UA: 1.02 (ref 1.010–1.025)
Urobilinogen, UA: 1 U/dL
pH, UA: 7 (ref 5.0–8.0)

## 2023-12-20 MED ORDER — NAPROXEN 375 MG PO TABS: 375.0000 mg | ORAL_TABLET | Freq: Two times a day (BID) | ORAL | 0 refills | Status: AC | PRN

## 2023-12-20 MED ORDER — METHOCARBAMOL 500 MG PO TABS: 500.0000 mg | ORAL_TABLET | Freq: Two times a day (BID) | ORAL | 0 refills | Status: AC | PRN

## 2023-12-20 NOTE — ED Provider Notes (Addendum)
[facility]    CSN: [id number] Arrival date & time: 12/20/23  1244      History   Chief Complaint Chief Complaint  Patient presents with   Groin Pain    Entered by patient    HPI Donald Baird is a 37 y.o. male presents for back pain.  Patient reports 1 week of an intermittent bilateral lower back pain that he feels sometimes wraps around to bilateral groins but not all the time.  Pain is an aching pain.  Denies any numbness/tingling/weakness of his lower extremities, no bowel or bladder incontinence, no saddle paresthesia.  No injury or known inciting event.  No dysuria.  No history of back pain or surgeries.  He took half of his wife's muscle relaxer 1 time for symptoms otherwise he has not taken any OTC medications for his symptoms.  No other concerns at this time.   Groin Pain    History reviewed. No pertinent past medical history.  There are no active problems to display for this patient.   History reviewed. No pertinent surgical history.     Home Medications    Prior to Admission medications   Medication Sig Start Date End Date Taking? Authorizing Provider  methocarbamol (ROBAXIN) 500 MG tablet Take 1 tablet (500 mg total) by mouth 2 (two) times daily as needed for muscle spasms (back pain). 12/20/23  Yes Julliette Frentz, Jodi R, NP  naproxen (NAPROSYN) 375 MG tablet Take 1 tablet (375 mg total) by mouth 2 (two) times daily as needed. 12/20/23  Yes Loreda Myla SAUNDERS, NP    Family History Family History  Problem Relation Age of Onset   Healthy Mother     Social History Social History   Tobacco Use   Smoking status: Never   Smokeless tobacco: Never  Vaping Use   Vaping status: Never Used  Substance Use Topics   Alcohol use: Yes    Comment: social   Drug use: No     Allergies   Patient has no known allergies.   Review of Systems Review of Systems  Musculoskeletal:  Positive for back pain.     Physical Exam Triage Vital Signs ED Triage  Vitals  Encounter Vitals Group     BP 12/20/23 1258 117/76     Girls Systolic BP Percentile --      Girls Diastolic BP Percentile --      Boys Systolic BP Percentile --      Boys Diastolic BP Percentile --      Pulse Rate 12/20/23 1258 62     Resp 12/20/23 1258 16     Temp 12/20/23 1258 98 F (36.7 C)     Temp Source 12/20/23 1258 Oral     SpO2 12/20/23 1258 98 %     Weight --      Height --      Head Circumference --      Peak Flow --      Pain Score 12/20/23 1257 8     Pain Loc --      Pain Education --      Exclude from Growth Chart --    No data found.  Updated Vital Signs BP 117/76   Pulse 62   Temp 98 F (36.7 C) (Oral)   Resp 16   SpO2 98%   Visual Acuity Right Eye Distance:   Left Eye Distance:   Bilateral Distance:    Right Eye Near:   Left Eye Near:  Bilateral Near:     Physical Exam Vitals and nursing note reviewed.  Constitutional:      General: He is not in acute distress.    Appearance: Normal appearance. He is not ill-appearing.  HENT:     Head: Normocephalic and atraumatic.  Eyes:     Pupils: Pupils are equal, round, and reactive to light.  Cardiovascular:     Rate and Rhythm: Normal rate.  Pulmonary:     Effort: Pulmonary effort is normal.  Abdominal:     Tenderness: There is no abdominal tenderness.     Hernia: No hernia is present.  Musculoskeletal:     Lumbar back: Spasms and tenderness present. No swelling, edema, deformity, signs of trauma, lacerations or bony tenderness. Normal range of motion. Negative right straight leg raise test and negative left straight leg raise test. No scoliosis.       Back:     Comments: Strength is 5 out of 5 bilateral lower extremities  Skin:    General: Skin is warm and dry.  Neurological:     General: No focal deficit present.     Mental Status: He is alert and oriented to person, place, and time.  Psychiatric:        Mood and Affect: Mood normal.        Behavior: Behavior normal.      UC  Treatments / Results  Labs (all labs ordered are listed, but only abnormal results are displayed) Labs Reviewed  POCT URINE DIPSTICK    EKG   Radiology No results found.  Procedures Procedures (including critical care time)  Medications Ordered in UC Medications - No data to display  Initial Impression / Assessment and Plan / UC Course  I have reviewed the triage vital signs and the nursing notes.  Pertinent labs & imaging results that were available during my care of the patient were reviewed by me and considered in my medical decision making (see chart for details).     Reviewed exam and symptoms with patient.  No red flags.  He declined Toradol injection.  Will do trial of Robaxin and naproxen.  Encouraged heat and rest.  PCP follow-up if symptoms do not improve.  ER precautions reviewed Final Clinical Impressions(s) / UC Diagnoses   Final diagnoses:  Dysuria  Acute bilateral low back pain without sciatica     Discharge Instructions      You may take Robaxin twice daily as needed for your back pain.  Please note this medication will make you drowsy.  Do not drink alcohol or drive on this medication.  You may also take naproxen twice daily as needed.  Take this medication with food.  Continue heat to the back and rest.  Please follow-up with your PCP if your symptoms do not improve.  Please go to the ER if you develop any worsening symptoms.  I hope you feel better soon!     ED Prescriptions     Medication Sig Dispense Auth. Provider   methocarbamol (ROBAXIN) 500 MG tablet Take 1 tablet (500 mg total) by mouth 2 (two) times daily as needed for muscle spasms (back pain). 10 tablet Lathan Gieselman, Jodi R, NP   naproxen (NAPROSYN) 375 MG tablet Take 1 tablet (375 mg total) by mouth 2 (two) times daily as needed. 10 tablet Troi Florendo, Jodi R, NP      PDMP not reviewed this encounter.   Loreda Myla SAUNDERS, NP 12/20/23 1320    Loreda Myla SAUNDERS, NP 12/20/23  1320  

## 2023-12-20 NOTE — ED Triage Notes (Signed)
 Pt c/o groin pain bilat and radiates into lower back while sittingx1wk. Pt states it's hard to bend over. Pt denies injury. PT denies urinary sx.

## 2023-12-20 NOTE — Discharge Instructions (Addendum)
 You may take Robaxin twice daily as needed for your back pain.  Please note this medication will make you drowsy.  Do not drink alcohol or drive on this medication.  You may also take naproxen twice daily as needed.  Take this medication with food.  Continue heat to the back and rest.  Please follow-up with your PCP if your symptoms do not improve.  Please go to the ER if you develop any worsening symptoms.  I hope you feel better soon!
